# Patient Record
Sex: Female | Born: 1980 | Race: White | Hispanic: No
Health system: Southern US, Community
[De-identification: ages and names within clinical notes are randomized; demographics above are authoritative.]

---

## 2003-11-20 ENCOUNTER — Ambulatory Visit: Payer: Self-pay | Admitting: Internal Medicine

## 2003-12-02 ENCOUNTER — Ambulatory Visit: Payer: Self-pay | Admitting: Internal Medicine

## 2004-01-21 ENCOUNTER — Observation Stay: Payer: Self-pay

## 2004-01-29 ENCOUNTER — Emergency Department: Payer: Self-pay | Admitting: Emergency Medicine

## 2004-05-15 ENCOUNTER — Observation Stay: Payer: Self-pay

## 2004-06-18 ENCOUNTER — Observation Stay: Payer: Self-pay | Admitting: Obstetrics and Gynecology

## 2004-07-18 ENCOUNTER — Observation Stay: Payer: Self-pay | Admitting: Unknown Physician Specialty

## 2004-09-24 ENCOUNTER — Emergency Department: Payer: Self-pay | Admitting: Emergency Medicine

## 2005-03-28 ENCOUNTER — Inpatient Hospital Stay: Payer: Self-pay | Admitting: Internal Medicine

## 2005-04-30 ENCOUNTER — Emergency Department: Payer: Self-pay | Admitting: Emergency Medicine

## 2005-05-26 ENCOUNTER — Emergency Department: Payer: Self-pay | Admitting: Emergency Medicine

## 2005-06-20 ENCOUNTER — Emergency Department: Payer: Self-pay | Admitting: General Practice

## 2005-08-23 ENCOUNTER — Ambulatory Visit: Payer: Self-pay

## 2006-01-09 ENCOUNTER — Emergency Department: Payer: Self-pay | Admitting: Emergency Medicine

## 2006-01-18 ENCOUNTER — Emergency Department: Payer: Self-pay | Admitting: Unknown Physician Specialty

## 2006-01-26 ENCOUNTER — Emergency Department: Payer: Self-pay | Admitting: Unknown Physician Specialty

## 2006-03-16 ENCOUNTER — Emergency Department: Payer: Self-pay | Admitting: Emergency Medicine

## 2006-03-24 ENCOUNTER — Emergency Department: Payer: Self-pay | Admitting: Emergency Medicine

## 2006-05-31 ENCOUNTER — Observation Stay: Payer: Self-pay | Admitting: Obstetrics and Gynecology

## 2007-04-26 ENCOUNTER — Emergency Department: Payer: Self-pay | Admitting: Emergency Medicine

## 2007-07-13 ENCOUNTER — Inpatient Hospital Stay: Payer: Self-pay | Admitting: Internal Medicine

## 2007-10-03 ENCOUNTER — Ambulatory Visit: Payer: Self-pay | Admitting: Family Medicine

## 2007-11-27 ENCOUNTER — Ambulatory Visit: Payer: Self-pay | Admitting: Urology

## 2007-11-29 ENCOUNTER — Ambulatory Visit: Payer: Self-pay | Admitting: Urology

## 2007-12-04 ENCOUNTER — Emergency Department: Payer: Self-pay | Admitting: Emergency Medicine

## 2008-08-31 ENCOUNTER — Ambulatory Visit: Payer: Self-pay | Admitting: Internal Medicine

## 2008-09-30 ENCOUNTER — Inpatient Hospital Stay: Payer: Self-pay | Admitting: Student

## 2008-12-13 ENCOUNTER — Emergency Department: Payer: Self-pay | Admitting: Unknown Physician Specialty

## 2009-01-13 ENCOUNTER — Emergency Department: Payer: Self-pay | Admitting: Emergency Medicine

## 2009-01-21 ENCOUNTER — Emergency Department: Payer: Self-pay | Admitting: Emergency Medicine

## 2009-06-06 ENCOUNTER — Emergency Department: Payer: Self-pay | Admitting: Emergency Medicine

## 2009-09-11 ENCOUNTER — Emergency Department: Payer: Self-pay | Admitting: Unknown Physician Specialty

## 2009-11-13 ENCOUNTER — Emergency Department: Payer: Self-pay | Admitting: Emergency Medicine

## 2009-11-18 ENCOUNTER — Emergency Department: Payer: Self-pay | Admitting: Emergency Medicine

## 2010-01-28 ENCOUNTER — Ambulatory Visit: Payer: Self-pay | Admitting: Unknown Physician Specialty

## 2010-03-29 ENCOUNTER — Emergency Department: Payer: Self-pay | Admitting: Emergency Medicine

## 2010-05-02 ENCOUNTER — Inpatient Hospital Stay: Payer: Self-pay | Admitting: Specialist

## 2010-10-27 ENCOUNTER — Ambulatory Visit: Payer: Self-pay | Admitting: Family Medicine

## 2010-10-28 ENCOUNTER — Emergency Department: Payer: Self-pay | Admitting: Internal Medicine

## 2011-02-15 ENCOUNTER — Inpatient Hospital Stay: Payer: Self-pay | Admitting: Internal Medicine

## 2011-02-15 LAB — COMPREHENSIVE METABOLIC PANEL
Albumin: 3.6 g/dL (ref 3.4–5.0)
Anion Gap: 7 (ref 7–16)
BUN: 11 mg/dL (ref 7–18)
Bilirubin,Total: 6.4 mg/dL — ABNORMAL HIGH (ref 0.2–1.0)
Chloride: 109 mmol/L — ABNORMAL HIGH (ref 98–107)
Co2: 23 mmol/L (ref 21–32)
Creatinine: 0.47 mg/dL — ABNORMAL LOW (ref 0.60–1.30)
EGFR (African American): >60
EGFR (Non-African Amer.): >60
Osmolality: 277 (ref 275–301)
Potassium: 3.7 mmol/L (ref 3.5–5.1)
SGPT (ALT): 74 U/L
Sodium: 139 mmol/L (ref 136–145)
Total Protein: 6.1 g/dL — ABNORMAL LOW (ref 6.4–8.2)

## 2011-02-15 LAB — URINALYSIS, COMPLETE
Glucose,UR: NEGATIVE mg/dL (ref 0–75)
Ketone: NEGATIVE
Nitrite: NEGATIVE
Ph: 5 (ref 4.5–8.0)
Protein: NEGATIVE
Specific Gravity: 1.011 (ref 1.003–1.030)
Squamous Epithelial: 1

## 2011-02-15 LAB — CSF CELL COUNT WITH DIFFERENTIAL
CSF Tube #: 3
Eosinophil: 0 %
Lymphocytes: 100 %
Monocytes/Macrophages: 0 %
Neutrophils: 0 %
RBC (CSF): 0 /mm3

## 2011-02-15 LAB — CBC
HGB: 7.5 g/dL — ABNORMAL LOW (ref 12.0–16.0)
MCH: 41.2 pg — ABNORMAL HIGH (ref 26.0–34.0)
MCHC: 32.3 g/dL (ref 32.0–36.0)
Platelet: 852 10*3/uL — ABNORMAL HIGH (ref 150–440)
RBC: 1.82 10*6/uL — ABNORMAL LOW (ref 3.80–5.20)
RDW: 16.3 % — ABNORMAL HIGH (ref 11.5–14.5)
WBC: 21.8 10*3/uL — ABNORMAL HIGH (ref 3.6–11.0)

## 2011-02-15 LAB — PREGNANCY, URINE: Pregnancy Test, Urine: NEGATIVE m[IU]/mL

## 2011-02-15 LAB — GLUCOSE, CSF: Glucose, CSF: 64 mg/dL (ref 40–75)

## 2011-02-15 LAB — PROTEIN, CSF: Protein, CSF: 34 mg/dL (ref 15–45)

## 2011-02-15 LAB — APTT: Activated PTT: 31.8 secs (ref 23.6–35.9)

## 2011-02-15 LAB — RAPID INFLUENZA A&B ANTIGENS

## 2011-02-16 LAB — VANCOMYCIN, TROUGH: Vancomycin, Trough: 7 ug/mL — ABNORMAL LOW (ref 10–20)

## 2011-02-16 LAB — URINE CULTURE

## 2011-02-16 LAB — CBC WITH DIFFERENTIAL/PLATELET
Bands: 4 %
Basophil %: 0.5 %
Eosinophil #: 0.1 10*3/uL (ref 0.0–0.7)
Eosinophil: 1 %
HGB: 9 g/dL — ABNORMAL LOW (ref 12.0–16.0)
MCH: 36.4 pg — ABNORMAL HIGH (ref 26.0–34.0)
MCHC: 33.1 g/dL (ref 32.0–36.0)
MCV: 110 fL — ABNORMAL HIGH (ref 80–100)
Monocyte #: 0.8 10*3/uL — ABNORMAL HIGH (ref 0.0–0.7)
Monocytes: 9 %
NRBC/100 WBC: 6 /
Neutrophil %: 59.1 %
Platelet: 533 10*3/uL — ABNORMAL HIGH (ref 150–440)
RBC: 2.48 10*6/uL — ABNORMAL LOW (ref 3.80–5.20)
Segmented Neutrophils: 57 %
WBC: 10.8 10*3/uL (ref 3.6–11.0)

## 2011-02-16 LAB — BASIC METABOLIC PANEL
Anion Gap: 7 (ref 7–16)
BUN: 6 mg/dL — ABNORMAL LOW (ref 7–18)
Calcium, Total: 8.3 mg/dL — ABNORMAL LOW (ref 8.5–10.1)
Co2: 24 mmol/L (ref 21–32)
EGFR (African American): >60
EGFR (Non-African Amer.): >60
Glucose: 104 mg/dL — ABNORMAL HIGH (ref 65–99)
Osmolality: 277 (ref 275–301)

## 2011-02-18 LAB — CSF CULTURE W GRAM STAIN

## 2011-02-19 ENCOUNTER — Emergency Department: Payer: Self-pay | Admitting: Emergency Medicine

## 2011-02-20 LAB — ETHANOL: Ethanol %: <0.003 % (ref 0.000–0.080)

## 2011-02-20 LAB — COMPREHENSIVE METABOLIC PANEL
Albumin: 4.2 g/dL (ref 3.4–5.0)
Anion Gap: 13 (ref 7–16)
BUN: 10 mg/dL (ref 7–18)
Bilirubin,Total: 3.2 mg/dL — ABNORMAL HIGH (ref 0.2–1.0)
Calcium, Total: 9.2 mg/dL (ref 8.5–10.1)
EGFR (African American): >60
Glucose: 95 mg/dL (ref 65–99)
Osmolality: 284 (ref 275–301)
SGOT(AST): 36 U/L (ref 15–37)
Sodium: 143 mmol/L (ref 136–145)
Total Protein: 7.7 g/dL (ref 6.4–8.2)

## 2011-02-20 LAB — CBC
HGB: 9.3 g/dL — ABNORMAL LOW (ref 12.0–16.0)
MCH: 35.7 pg — ABNORMAL HIGH (ref 26.0–34.0)
Platelet: 766 10*3/uL — ABNORMAL HIGH (ref 150–440)
RBC: 2.61 10*6/uL — ABNORMAL LOW (ref 3.80–5.20)

## 2011-02-21 LAB — CULTURE, BLOOD (SINGLE)

## 2011-03-16 ENCOUNTER — Emergency Department: Payer: Self-pay | Admitting: Emergency Medicine

## 2011-05-02 ENCOUNTER — Emergency Department: Payer: Self-pay | Admitting: Emergency Medicine

## 2011-08-26 ENCOUNTER — Inpatient Hospital Stay: Payer: Self-pay | Admitting: Internal Medicine

## 2011-08-26 LAB — CBC WITH DIFFERENTIAL/PLATELET
HCT: 22.8 % — ABNORMAL LOW (ref 35.0–47.0)
HGB: 7.3 g/dL — ABNORMAL LOW (ref 12.0–16.0)
Lymphocytes: 39 %
MCHC: 32.2 g/dL (ref 32.0–36.0)
Monocytes: 7 %
RDW: 17 % — ABNORMAL HIGH (ref 11.5–14.5)
Segmented Neutrophils: 54 %
WBC: 16.4 10*3/uL — ABNORMAL HIGH (ref 3.6–11.0)

## 2011-08-26 LAB — COMPREHENSIVE METABOLIC PANEL
Albumin: 4.1 g/dL (ref 3.4–5.0)
BUN: 9 mg/dL (ref 7–18)
Bilirubin,Total: 4.2 mg/dL — ABNORMAL HIGH (ref 0.2–1.0)
Co2: 22 mmol/L (ref 21–32)
Creatinine: 0.62 mg/dL (ref 0.60–1.30)
EGFR (African American): >60
Glucose: 84 mg/dL (ref 65–99)
Osmolality: 275 (ref 275–301)
Potassium: 3.8 mmol/L (ref 3.5–5.1)
SGOT(AST): 37 U/L (ref 15–37)
SGPT (ALT): 56 U/L
Sodium: 139 mmol/L (ref 136–145)
Total Protein: 7.2 g/dL (ref 6.4–8.2)

## 2011-08-26 LAB — URINALYSIS, COMPLETE
Blood: NEGATIVE
Glucose,UR: NEGATIVE mg/dL (ref 0–75)
Ketone: NEGATIVE
Nitrite: NEGATIVE
Ph: 6 (ref 4.5–8.0)
Protein: NEGATIVE
Specific Gravity: 1.01 (ref 1.003–1.030)
Squamous Epithelial: 4
WBC UR: 3 /HPF (ref 0–5)

## 2011-08-26 LAB — PROTIME-INR: INR: 1.1

## 2011-08-26 LAB — CK TOTAL AND CKMB (NOT AT ARMC): CK-MB: 1.2 ng/mL (ref 0.5–3.6)

## 2011-08-27 LAB — BASIC METABOLIC PANEL
Anion Gap: 9 (ref 7–16)
BUN: 12 mg/dL (ref 7–18)
Chloride: 110 mmol/L — ABNORMAL HIGH (ref 98–107)
Creatinine: 0.57 mg/dL — ABNORMAL LOW (ref 0.60–1.30)
EGFR (African American): >60
EGFR (Non-African Amer.): >60
Glucose: 101 mg/dL — ABNORMAL HIGH (ref 65–99)
Sodium: 141 mmol/L (ref 136–145)

## 2011-08-27 LAB — HEMOGLOBIN: HGB: 6.9 g/dL — ABNORMAL LOW (ref 12.0–16.0)

## 2011-08-27 LAB — CBC WITH DIFFERENTIAL/PLATELET
Eosinophil: 6 %
HCT: 19.8 % — ABNORMAL LOW (ref 35.0–47.0)
HGB: 6.7 g/dL — ABNORMAL LOW (ref 12.0–16.0)
MCH: 45.3 pg — ABNORMAL HIGH (ref 26.0–34.0)
MCV: 134 fL — ABNORMAL HIGH (ref 80–100)
Monocytes: 7 %
NRBC/100 WBC: 10 /
Platelet: 648 10*3/uL — ABNORMAL HIGH (ref 150–440)
RBC: 1.48 10*6/uL — ABNORMAL LOW (ref 3.80–5.20)
RDW: 16.2 % — ABNORMAL HIGH (ref 11.5–14.5)
Segmented Neutrophils: 31 %
WBC: 8.7 10*3/uL (ref 3.6–11.0)

## 2011-08-28 LAB — URINE CULTURE

## 2011-09-01 LAB — CULTURE, BLOOD (SINGLE)

## 2012-03-16 ENCOUNTER — Emergency Department: Payer: Self-pay | Admitting: Emergency Medicine

## 2012-03-17 ENCOUNTER — Emergency Department: Payer: Self-pay | Admitting: Emergency Medicine

## 2012-03-17 LAB — URINALYSIS, COMPLETE
Bilirubin,UR: NEGATIVE
Glucose,UR: NEGATIVE mg/dL (ref 0–75)
Ketone: NEGATIVE
Ph: 5 (ref 4.5–8.0)
Protein: NEGATIVE
RBC,UR: 1 /HPF (ref 0–5)
Specific Gravity: 1.015 (ref 1.003–1.030)
Squamous Epithelial: 2

## 2012-03-17 LAB — COMPREHENSIVE METABOLIC PANEL
Albumin: 3.7 g/dL (ref 3.4–5.0)
Anion Gap: 11 (ref 7–16)
BUN: 13 mg/dL (ref 7–18)
Calcium, Total: 8.2 mg/dL — ABNORMAL LOW (ref 8.5–10.1)
Chloride: 108 mmol/L — ABNORMAL HIGH (ref 98–107)
Creatinine: 0.37 mg/dL — ABNORMAL LOW (ref 0.60–1.30)
EGFR (African American): >60
Glucose: 101 mg/dL — ABNORMAL HIGH (ref 65–99)
Osmolality: 278 (ref 275–301)
Potassium: 4.3 mmol/L (ref 3.5–5.1)
SGOT(AST): 65 U/L — ABNORMAL HIGH (ref 15–37)
Sodium: 139 mmol/L (ref 136–145)
Total Protein: 6.9 g/dL (ref 6.4–8.2)

## 2012-03-17 LAB — RAPID INFLUENZA A&B ANTIGENS

## 2012-03-17 LAB — CBC
HGB: 7.3 g/dL — ABNORMAL LOW (ref 12.0–16.0)
MCHC: 32 g/dL (ref 32.0–36.0)
RBC: 1.73 10*6/uL — ABNORMAL LOW (ref 3.80–5.20)
WBC: 16.7 10*3/uL — ABNORMAL HIGH (ref 3.6–11.0)

## 2012-03-30 ENCOUNTER — Emergency Department: Payer: Self-pay | Admitting: Emergency Medicine

## 2012-03-30 LAB — COMPREHENSIVE METABOLIC PANEL
Albumin: 4.1 g/dL (ref 3.4–5.0)
BUN: 12 mg/dL (ref 7–18)
Bilirubin,Total: 5.2 mg/dL — ABNORMAL HIGH (ref 0.2–1.0)
Calcium, Total: 8.4 mg/dL — ABNORMAL LOW (ref 8.5–10.1)
Chloride: 107 mmol/L (ref 98–107)
EGFR (Non-African Amer.): >60
Potassium: 3.5 mmol/L (ref 3.5–5.1)
SGOT(AST): 99 U/L — ABNORMAL HIGH (ref 15–37)
SGPT (ALT): 123 U/L — ABNORMAL HIGH (ref 12–78)
Sodium: 137 mmol/L (ref 136–145)

## 2012-03-30 LAB — CBC
HCT: 22.3 % — ABNORMAL LOW (ref 35.0–47.0)
HGB: 7 g/dL — ABNORMAL LOW (ref 12.0–16.0)
MCH: 40.6 pg — ABNORMAL HIGH (ref 26.0–34.0)
MCHC: 31.5 g/dL — ABNORMAL LOW (ref 32.0–36.0)
MCV: 129 fL — ABNORMAL HIGH (ref 80–100)
Platelet: 905 10*3/uL — ABNORMAL HIGH (ref 150–440)
RDW: 13.6 % (ref 11.5–14.5)

## 2012-03-30 LAB — TROPONIN I: Troponin-I: <0.02 ng/mL

## 2012-03-31 LAB — URINALYSIS, COMPLETE
Bacteria: NONE SEEN
Bilirubin,UR: NEGATIVE
Glucose,UR: NEGATIVE mg/dL (ref 0–75)
Ketone: NEGATIVE
Protein: NEGATIVE
RBC,UR: 2 /HPF (ref 0–5)
Specific Gravity: 1.016 (ref 1.003–1.030)
Squamous Epithelial: <1

## 2012-06-28 LAB — CBC
HCT: 22.6 % — ABNORMAL LOW (ref 35.0–47.0)
MCV: 129 fL — ABNORMAL HIGH (ref 80–100)
Platelet: 833 10*3/uL — ABNORMAL HIGH (ref 150–440)
RBC: 1.76 10*6/uL — ABNORMAL LOW (ref 3.80–5.20)
WBC: 14.5 10*3/uL — ABNORMAL HIGH (ref 3.6–11.0)

## 2012-06-28 LAB — COMPREHENSIVE METABOLIC PANEL
Albumin: 4.3 g/dL (ref 3.4–5.0)
Anion Gap: 7 (ref 7–16)
BUN: 10 mg/dL (ref 7–18)
Bilirubin,Total: 4.8 mg/dL — ABNORMAL HIGH (ref 0.2–1.0)
Co2: 24 mmol/L (ref 21–32)
EGFR (African American): >60
Osmolality: 274 (ref 275–301)
Potassium: 3.6 mmol/L (ref 3.5–5.1)
SGOT(AST): 50 U/L — ABNORMAL HIGH (ref 15–37)
SGPT (ALT): 67 U/L (ref 12–78)
Sodium: 138 mmol/L (ref 136–145)
Total Protein: 7.5 g/dL (ref 6.4–8.2)

## 2012-06-28 LAB — PREGNANCY, URINE: Pregnancy Test, Urine: NEGATIVE m[IU]/mL

## 2012-06-28 LAB — URINALYSIS, COMPLETE
Bacteria: NONE SEEN
Glucose,UR: NEGATIVE mg/dL (ref 0–75)
Leukocyte Esterase: NEGATIVE
Ph: 6 (ref 4.5–8.0)
Protein: NEGATIVE
Specific Gravity: 1.017 (ref 1.003–1.030)
Squamous Epithelial: 1

## 2012-06-29 ENCOUNTER — Inpatient Hospital Stay: Payer: Self-pay | Admitting: Internal Medicine

## 2012-06-29 LAB — BASIC METABOLIC PANEL
Anion Gap: 8 (ref 7–16)
BUN: 6 mg/dL — ABNORMAL LOW (ref 7–18)
Calcium, Total: 8 mg/dL — ABNORMAL LOW (ref 8.5–10.1)
Chloride: 110 mmol/L — ABNORMAL HIGH (ref 98–107)
Co2: 24 mmol/L (ref 21–32)
EGFR (African American): >60
EGFR (Non-African Amer.): >60
Osmolality: 281 (ref 275–301)
Potassium: 4.2 mmol/L (ref 3.5–5.1)
Sodium: 142 mmol/L (ref 136–145)

## 2012-06-29 LAB — CBC WITH DIFFERENTIAL/PLATELET
Basophil: 3 %
Eosinophil: 3 %
HCT: 19.2 % — ABNORMAL LOW (ref 35.0–47.0)
HGB: 6.3 g/dL — ABNORMAL LOW (ref 12.0–16.0)
MCHC: 32.7 g/dL (ref 32.0–36.0)
Monocytes: 5 %
NRBC/100 WBC: 2 /
RBC: 1.48 10*6/uL — ABNORMAL LOW (ref 3.80–5.20)

## 2012-06-30 LAB — CBC WITH DIFFERENTIAL/PLATELET
Basophil: 1 %
Lymphocytes: 56 %
MCH: 39.1 pg — ABNORMAL HIGH (ref 26.0–34.0)
MCHC: 34.2 g/dL (ref 32.0–36.0)
MCV: 114 fL — ABNORMAL HIGH (ref 80–100)
Monocytes: 3 %
Platelet: 646 10*3/uL — ABNORMAL HIGH (ref 150–440)
RBC: 1.94 10*6/uL — ABNORMAL LOW (ref 3.80–5.20)
RDW: 25.4 % — ABNORMAL HIGH (ref 11.5–14.5)
Segmented Neutrophils: 37 %
WBC: 11.9 10*3/uL — ABNORMAL HIGH (ref 3.6–11.0)

## 2012-07-04 LAB — CULTURE, BLOOD (SINGLE)

## 2012-11-22 ENCOUNTER — Emergency Department: Payer: Self-pay | Admitting: Emergency Medicine

## 2012-11-22 LAB — URINALYSIS, COMPLETE
Bacteria: NONE SEEN
Bilirubin,UR: NEGATIVE
Glucose,UR: NEGATIVE mg/dL (ref 0–75)
Ketone: NEGATIVE
Nitrite: NEGATIVE
Ph: 6 (ref 4.5–8.0)
Protein: NEGATIVE
Specific Gravity: 1.004 (ref 1.003–1.030)
WBC UR: 1 /HPF (ref 0–5)

## 2012-11-22 LAB — CK TOTAL AND CKMB (NOT AT ARMC)
CK, Total: 45 U/L (ref 21–215)
CK-MB: 0.9 ng/mL (ref 0.5–3.6)

## 2012-11-22 LAB — DRUG SCREEN, URINE
Amphetamines, Ur Screen: NEGATIVE (ref ?–1000)
Barbiturates, Ur Screen: NEGATIVE (ref ?–200)
Benzodiazepine, Ur Scrn: NEGATIVE (ref ?–200)
Cannabinoid 50 Ng, Ur ~~LOC~~: NEGATIVE (ref ?–50)
Cocaine Metabolite,Ur ~~LOC~~: NEGATIVE (ref ?–300)
Methadone, Ur Screen: NEGATIVE (ref ?–300)
Opiate, Ur Screen: NEGATIVE (ref ?–300)

## 2012-11-22 LAB — BASIC METABOLIC PANEL
Anion Gap: 2 — ABNORMAL LOW (ref 7–16)
Chloride: 109 mmol/L — ABNORMAL HIGH (ref 98–107)
Co2: 23 mmol/L (ref 21–32)
Creatinine: <0.1 mg/dL — ABNORMAL LOW (ref 0.60–1.30)
Osmolality: 266 (ref 275–301)
Sodium: 134 mmol/L — ABNORMAL LOW (ref 136–145)

## 2012-11-22 LAB — HEPATIC FUNCTION PANEL A (ARMC)
Albumin: 3.6 g/dL (ref 3.4–5.0)
Alkaline Phosphatase: 93 U/L (ref 50–136)
Bilirubin, Direct: 0.2 mg/dL (ref 0.00–0.20)
Bilirubin,Total: 5.8 mg/dL — ABNORMAL HIGH (ref 0.2–1.0)
SGOT(AST): 73 U/L — ABNORMAL HIGH (ref 15–37)
Total Protein: 10.3 g/dL — ABNORMAL HIGH (ref 6.4–8.2)

## 2012-11-22 LAB — POTASSIUM: Potassium: 3.9 mmol/L (ref 3.5–5.1)

## 2012-11-22 LAB — CBC
HCT: 25 % — ABNORMAL LOW (ref 35.0–47.0)
HGB: 7.9 g/dL — ABNORMAL LOW (ref 12.0–16.0)
MCHC: 31.5 g/dL — ABNORMAL LOW (ref 32.0–36.0)
MCV: 138 fL — ABNORMAL HIGH (ref 80–100)
Platelet: 785 10*3/uL — ABNORMAL HIGH (ref 150–440)
RBC: 1.82 10*6/uL — ABNORMAL LOW (ref 3.80–5.20)
RDW: 16 % — ABNORMAL HIGH (ref 11.5–14.5)
WBC: 12.8 10*3/uL — ABNORMAL HIGH (ref 3.6–11.0)

## 2012-11-22 LAB — LIPASE, BLOOD: Lipase: 95 U/L (ref 73–393)

## 2012-11-22 LAB — PREGNANCY, URINE: Pregnancy Test, Urine: NEGATIVE m[IU]/mL

## 2012-11-22 LAB — TROPONIN I: Troponin-I: <0.02 ng/mL

## 2013-01-23 ENCOUNTER — Emergency Department: Payer: Self-pay | Admitting: Emergency Medicine

## 2013-01-23 LAB — BASIC METABOLIC PANEL
Anion Gap: 6 — ABNORMAL LOW (ref 7–16)
BUN: 12 mg/dL (ref 7–18)
Chloride: 105 mmol/L (ref 98–107)
Co2: 24 mmol/L (ref 21–32)
EGFR (African American): >60
EGFR (Non-African Amer.): >60
Glucose: 125 mg/dL — ABNORMAL HIGH (ref 65–99)
Osmolality: 271 (ref 275–301)
Potassium: 3.7 mmol/L (ref 3.5–5.1)
Sodium: 135 mmol/L — ABNORMAL LOW (ref 136–145)

## 2013-01-23 LAB — URINALYSIS, COMPLETE
Bilirubin,UR: NEGATIVE
Glucose,UR: NEGATIVE mg/dL (ref 0–75)
Ketone: NEGATIVE
Nitrite: NEGATIVE
Protein: NEGATIVE
RBC,UR: 1 /HPF (ref 0–5)
Specific Gravity: 1.017 (ref 1.003–1.030)
Squamous Epithelial: 1
WBC UR: 4 /HPF (ref 0–5)

## 2013-01-23 LAB — CBC
MCH: 40.3 pg — ABNORMAL HIGH (ref 26.0–34.0)
MCHC: 32.4 g/dL (ref 32.0–36.0)
Platelet: 658 10*3/uL — ABNORMAL HIGH (ref 150–440)
RBC: 1.57 10*6/uL — ABNORMAL LOW (ref 3.80–5.20)

## 2013-01-23 LAB — PROTIME-INR
INR: 1.2
Prothrombin Time: 14.9 secs — ABNORMAL HIGH (ref 11.5–14.7)

## 2013-01-23 LAB — RAPID INFLUENZA A&B ANTIGENS

## 2013-01-25 LAB — CBC WITH DIFFERENTIAL/PLATELET
Comment - H1-Com5: NORMAL
Eosinophil: 2 %
HCT: 21 % — ABNORMAL LOW (ref 35.0–47.0)
HGB: 7.2 g/dL — ABNORMAL LOW (ref 12.0–16.0)
Lymphocytes: 48 %
MCHC: 34.2 g/dL (ref 32.0–36.0)
MCV: 108 fL — ABNORMAL HIGH (ref 80–100)
NRBC/100 WBC: 4 /
RBC: 1.94 10*6/uL — ABNORMAL LOW (ref 3.80–5.20)
RDW: 26.3 % — ABNORMAL HIGH (ref 11.5–14.5)
WBC: 9.4 10*3/uL (ref 3.6–11.0)

## 2013-01-25 LAB — URINALYSIS, COMPLETE
Bacteria: NONE SEEN
Bilirubin,UR: NEGATIVE
Glucose,UR: NEGATIVE mg/dL (ref 0–75)
Protein: NEGATIVE
Squamous Epithelial: 2
WBC UR: 4 /HPF (ref 0–5)

## 2013-01-25 LAB — COMPREHENSIVE METABOLIC PANEL
Albumin: 3.8 g/dL (ref 3.4–5.0)
Bilirubin,Total: 4.7 mg/dL — ABNORMAL HIGH (ref 0.2–1.0)
Calcium, Total: 8.6 mg/dL (ref 8.5–10.1)
Chloride: 104 mmol/L (ref 98–107)
Co2: 25 mmol/L (ref 21–32)
Creatinine: 0.37 mg/dL — ABNORMAL LOW (ref 0.60–1.30)
EGFR (African American): >60
EGFR (Non-African Amer.): >60
Glucose: 100 mg/dL — ABNORMAL HIGH (ref 65–99)
Osmolality: 267 (ref 275–301)
SGOT(AST): 115 U/L — ABNORMAL HIGH (ref 15–37)
SGPT (ALT): 108 U/L — ABNORMAL HIGH (ref 12–78)

## 2013-01-25 LAB — DRUG SCREEN, URINE
Barbiturates, Ur Screen: NEGATIVE (ref ?–200)
Benzodiazepine, Ur Scrn: NEGATIVE (ref ?–200)
Cocaine Metabolite,Ur ~~LOC~~: NEGATIVE (ref ?–300)
Opiate, Ur Screen: NEGATIVE (ref ?–300)
Phencyclidine (PCP) Ur S: NEGATIVE (ref ?–25)
Tricyclic, Ur Screen: NEGATIVE (ref ?–1000)

## 2013-01-25 LAB — PREGNANCY, URINE: Pregnancy Test, Urine: NEGATIVE m[IU]/mL

## 2013-01-25 LAB — HCG, QUANTITATIVE, PREGNANCY: Beta Hcg, Quant.: <1 m[IU]/mL — ABNORMAL LOW

## 2013-01-26 ENCOUNTER — Inpatient Hospital Stay: Payer: Self-pay | Admitting: Internal Medicine

## 2013-01-26 LAB — CBC WITH DIFFERENTIAL/PLATELET
HCT: 18.8 % — ABNORMAL LOW (ref 35.0–47.0)
Lymphocytes: 50 %
MCH: 36.4 pg — ABNORMAL HIGH (ref 26.0–34.0)
MCV: 110 fL — ABNORMAL HIGH (ref 80–100)
Platelet: 650 10*3/uL — ABNORMAL HIGH (ref 150–440)
RBC: 1.7 10*6/uL — ABNORMAL LOW (ref 3.80–5.20)
RDW: 25.8 % — ABNORMAL HIGH (ref 11.5–14.5)

## 2013-01-26 LAB — COMPREHENSIVE METABOLIC PANEL
Anion Gap: 4 — ABNORMAL LOW (ref 7–16)
BUN: 6 mg/dL — ABNORMAL LOW (ref 7–18)
Bilirubin,Total: 3.3 mg/dL — ABNORMAL HIGH (ref 0.2–1.0)
Calcium, Total: 7.9 mg/dL — ABNORMAL LOW (ref 8.5–10.1)
Chloride: 106 mmol/L (ref 98–107)
Co2: 27 mmol/L (ref 21–32)
Creatinine: 0.52 mg/dL — ABNORMAL LOW (ref 0.60–1.30)
EGFR (African American): >60
EGFR (Non-African Amer.): >60
Glucose: 100 mg/dL — ABNORMAL HIGH (ref 65–99)
Osmolality: 272 (ref 275–301)
Potassium: 3.9 mmol/L (ref 3.5–5.1)
SGPT (ALT): 91 U/L — ABNORMAL HIGH (ref 12–78)
Sodium: 137 mmol/L (ref 136–145)
Total Protein: 5.7 g/dL — ABNORMAL LOW (ref 6.4–8.2)

## 2013-01-27 LAB — CBC WITH DIFFERENTIAL/PLATELET
Comment - H1-Com4: NORMAL
Eosinophil: 1 %
Lymphocytes: 46 %
MCH: 34.6 pg — ABNORMAL HIGH (ref 26.0–34.0)
MCHC: 33 g/dL (ref 32.0–36.0)
Monocytes: 3 %
NRBC/100 WBC: 3 /
Platelet: 661 10*3/uL — ABNORMAL HIGH (ref 150–440)
RBC: 2.32 10*6/uL — ABNORMAL LOW (ref 3.80–5.20)
RDW: 26.7 % — ABNORMAL HIGH (ref 11.5–14.5)
WBC: 13.3 10*3/uL — ABNORMAL HIGH (ref 3.6–11.0)

## 2013-01-28 LAB — WBCS, STOOL

## 2013-01-29 LAB — STOOL CULTURE

## 2014-05-20 NOTE — H&P (Signed)
PATIENT NAME:  Ruth Shelton, GOUGE MR#:  [id number] DATE OF BIRTH:  [birth year]  DATE OF ADMISSION:  08/26/2011  REFERRING PHYSICIAN:  Dr. Daryel November PRIMARY CARE PHYSICIAN:  [facility]    CHIEF COMPLAINT: Feeling unwell, chronic fevers.   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 The patient stated that she has been having some dizziness for the past week or so. She has started to have bouts of diarrhea, multiple per day, lasting several days about a week ago, which have now resolved. She has formed stool now. She feels some soreness in her belly, however. She started to feel unwell again and started to have left eyelid drooping with some tingling in her arms lasting less than a minute. She has been overall running low-grade fevers for several months per her. Every time she goes to her primary care physician they tell her she has a low-grade fever.  Her temperature runs about 100.2. She noticed yesterday development of swelling in bilateral lymph nodes on both sides. She denies having any sore throat. No cough. No shortness of breath. She has some right ear pain, however. Hospitalist service was contacted for further evaluation and management as she was noted to have leukocytosis of 16,000 and some tachycardia.   PAST MEDICAL HISTORY:  1. Pyruvate kinase deficiency. 2. Anemia requiring transfusions with baseline hemoglobin around seven. 3. History of iron overload. 4. 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 5. History of cerebrovascular accident in the past. 6. History of seizures in the past.  7. Status post splenectomy, cholecystectomy, and two C-sections.   ALLERGIES: Aspirin and morphine.   CURRENT MEDICATIONS:  1. Penicillin VK 250 mg b.i.d.   2. Folic acid 1 mg daily.  3. Zyrtec p.r.n.  4. Flonase p.r.n.  5. Ativan p.r.n., does not know the dosage.   SOCIAL HISTORY: Smokes half a pack a day. No alcohol or drug use. She is on disability.   FAMILY HISTORY: Brother with testicular cancer and myocardial infarction.   REVIEW OF SYSTEMS: CONSTITUTIONAL: Low-grade fevers, fatigue, and overall weakness and some abdominal cramping. No weight changes. EYES: Has some blurry vision. No glaucoma. ENT: Some ringing in the ear on the right side. Some ear pain on the right. No hearing loss, or discharge.  RESPIRATORY: No cough, wheezing, hemoptysis, or shortness of breath. CARDIOVASCULAR: No chest pain, arrhythmia, or high blood pressure history. GI: No vomiting, has had some nausea for the past week, abdominal "burning sensation".  No hematemesis or melena. Had a bout of diarrhea which resolved about a week ago. GU: Denies dysuria or hematuria. HEME/LYMPH: Some swelling under the chin bilaterally. ENDOCRINE: No polyuria or nocturia.  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 No bleeding. Positive for swollen glands. SKIN: No new rashes. MUSCULOSKELETAL: Chronic arthritis.  NEUROLOGIC: Tingling in the left arm. No focal weakness. History of CVA.  PSYCH: Has some anxiety.   PHYSICAL EXAMINATION:  VITAL SIGNS: Temperature 100.1, pulse 99, respiratory rate 20, blood pressure 118/56, O2 sats  100% on room air.   GENERAL: The patient is a pleasant Caucasian female laying in bed in no obvious distress, talking in full sentences.   HEENT: Normocephalic, atraumatic. Pupils are equal and reactive. Pale conjunctivae. Anicteric sclerae. Dry mucous membranes. Extraocular muscles intact.  No exudate  on visualization of the throat.  Examination of the ears- on the right side it is tender on palpation of the tinea as well as some exudate. No bulging tympanic membrane, however.   NECK: Supple. No thyroid tenderness. There is bilateral  submandibular tender lymphadenopathy.   CARDIOVASCULAR: S1, S2, regular rhythm, tachycardic. No murmurs.   LUNGS: Clear to auscultation without wheezing or rhonchi.   ABDOMEN: Soft, nondistended. Slight tenderness on the right side. Positive bowel sounds in all quadrants.   EXTREMITIES: No significant lower extremity edema.   NEUROLOGICAL: Cranial nerves II-XII grossly intact. Strength is 5/5 in all extremities. Sensation is intact to light touch.   PSYCH: Awake, alert, oriented times three.  Pleasant, cooperative.   LABORATORY, DIAGNOSTIC, AND RADIOLOGICAL DATA:  Chloride 108, potassium 3.8, sodium 139, creatinine 0.62. LFTs: Total bilirubin is 4.2 and appears to be chronically elevated, otherwise within normal limits. Troponin negative times one. CK total 54. WBC 16.4. Hemoglobin 7.3, hematocrit 22.8,  platelets are 746. Of note, previous hemoglobin was 9.3 on 01/20.  INR 1.1. Urinalysis 2+ leukocyte esterase, no nitrites, four epithelial cells, three WBCs. CT of the head without contrast showing no acute evidence of intracranial abnormalities. X-ray of the chest is pending.   ASSESSMENT AND PLAN: We have a 34 year old female with history pyruvate kinase deficiency, chronic hemolytic anemia, and chronic hyperbilirubinemia with low-grade fevers for several months with an episode of diarrhea, now resolved, with weakness, fatigue, some lymphadenopathy and found to have SIRS criteria and probable otitis media. At this point we will admit the patient to the hospital. The patient does have SIRS criteria including tachycardia and significant leukocytosis. Thus far urinalysis is not suggestive of infection. X-ray of the chest although pending would likely not suggest a pneumonia as the patient has no hypoxia or cough which is productive or shortness of breath. On exam the patient appears to have otitis media. I will start the patient on Levaquin IV for now. Strep throat cultures are also done which are  pending. It is possible that this is viral in conjunction with a recent probable viral enteritis that the patient had. She has no further diarrhea at this point. Nonetheless, I will send in some stool cultures at this point. Follow x-ray of the chest. We will follow her fever curve at this point. In regards to her pyruvate kinase deficiency, she states her hemoglobins is in the sevens and this is around her baseline. The patient has chronic hyperbilirubinemia, which appears to be stable. I will trend her hemoglobin. If it trends lower, I will consider transfusion. She did complain of having some left eye droop and some tingling sensation in her left arm, which were transient and have currently resolved. She is neurologically intact and had a CT of the head, which is negative. I will continue her folic acid. For deep vein thrombosis prophylaxis she will be started on TEDs and SCDs.   CODE STATUS: The patient is FULL CODE.   TOTAL TIME SPENT: 50 minutes.   ____________________________ Krystal EatonShayiq Daleon Willinger, MD sa:bjt D: 08/26/2011 16:38:05 ET T: 08/26/2011 17:12:22 ET JOB#: [id number]  cc: Krystal EatonShayiq Valda Christenson, MD, <Dictator> [facility] IncHAYIQ [facility] CenterHMADZIA MD ELECTRONICALLY SIGNED 09/13/2011 15:17

## 2014-05-20 NOTE — Discharge Summary (Signed)
PATIENT NAME:  Ruth BrookeSOUTHARD, Little J MR#:  [id number] DATE OF BIRTH:  1980-02-08  DATE OF ADMISSION:  08/26/2011 DATE OF DISCHARGE:  08/28/2011  PRIMARY CARE PHYSICIAN: [facility]   PRESENTING COMPLAINT: Not feeling well with low-grade fever.    DISCHARGE DIAGNOSES:  1. Suspected viral illness.  2. Headache.  3. Right earache.  4. History of pyruvate kinase deficiency with chronic anemia.   CONDITION ON DISCHARGE: Fair.   MEDICATIONS:  1. Ranitidine 150 mg 1 capsule p.o. b.i.d.  2. Folic acid 1 mg p.o. daily.  3. Penicillin V 250 p.o. b.i.d.  4. Toradol 15 mg t.i.d. with meals p.r.n.  5. Levaquin 250 p.o. daily for three more days.  6. Tylenol 500 mg p.o. t.i.d. for headaches.   FOLLOW-UP:  1. Follow-up with Dr. Darreld McleanLinda Miles in 1 to 2 weeks.  2. Get Neurology referral through your primary care for headaches.   LABORATORY, DIAGNOSTIC, AND RADIOLOGICAL DATA: Hemoglobin at discharge 6.9, white count 8.7, platelet count 648, glucose 101, BUN 12, creatinine 0.57, sodium 141, potassium 4.0, chloride 110. Blood cultures negative in 36 hours.   Chest x-ray mild hyperinflation which may be voluntary or could reflect reactive airway disease.   Strep culture moderate growth of beta-hemolytic strep Group C. White count on admission was 16.4. Comprehensive metabolic panel within normal limits. Urinalysis negative for urinary tract infection. Urine culture mixed bacterial organisms.   HISTORY OF PRESENT ILLNESS: Ms. Ruth Shelton is a 34 year old Caucasian female with history of pyruvate kinase deficiency, and depression admitted with fever, headache, and diarrhea. She was admitted with:  1. Low-grade fever, mild leukocytosis. The patient did have symptoms of sore throat. She was already on Penicillin V at admission. She was started on p.o. Levaquin for earache. No ear discharge was noted. Right ear exam looked okay. She did not have any cervical neck lymphadenopathy. Her strep throat results were noted  after discharge and positive for Group C Strep. The patient is already on Levaquin and has a prescription penicillin which she was also recommended to continue taking. Her white count normalized and headache resolved. She did not have any fever while in the hospital.  2. Headache, could be part of viral syndrome. It could be possibly a new migraine headache. The patient says she's been having headaches on and off over the last several months. She was given IV Dilaudid and p.o. Percocet, however, Toradol worked for her. Will give her some prescription for p.r.n. basis only. She was advised to get Neurology opinion through [facility] referral.  3. Hypokalemia due to diarrhea, replaced.  4. Diarrhea, resolved, likely due to viral syndrome.  5. Depression.  6. History of chronic anemia due to pyruvate kinase deficiency. [facility] I talk with Dr. Josie SaundersGerard, Hematology on call covering for Dr. Marcheta GrammesMa. No indication for blood transfusion; need to follow-up with Hematology after discharge was recommended.   Hospital stay otherwise remained stable.   CODE STATUS: The patient remained a FULL CODE.   TIME SPENT: 40 minutes.   ____________________________ Wylie HailSona A. Allena KatzPatel, MD sap:drc D: 08/29/2011 12:40:27 ET T: 08/30/2011 11:21:25 ET JOB#: [id number]  cc: Myshawn Chiriboga A. Allena KatzPatel, MD, <Dictator>, [facility] Willow OraSONA A Johann Gascoigne MD ELECTRONICALLY SIGNED 09/06/2011 7:17

## 2014-05-23 NOTE — Discharge Summary (Signed)
PATIENT NAME:  Ruth Shelton, Ruth Shelton MR#:  [id number] DATE OF BIRTH:  [birth year]  DATE OF ADMISSION:  06/29/2012 DATE OF DISCHARGE:  06/30/2012  ADMITTING PHYSICIAN:  Dr. Clerance LavPadmaja Vasireddy.   DISCHARGING PHYSICIAN: Enid Baasadhika Feige Lowdermilk, MD.   PRIMARY MD: Dr. Darreld McleanLinda Miles.   DISCHARGE DIAGNOSES 1.  Flulike illness.  2.  Acute-on-chronic anemia.  3.  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  4.  Sinusitis.  5.  Sinus headache.   DISCHARGE MEDICATIONS:  1.  Penicillin-V. 2.  Potassium, 250 mg p.o. b.i.d.  3.  Folic acid 1 mg p.o. daily.  4.  Ativan 0.5 mg a day as needed for anxiety.  5.  Tramadol 50 mg p.o. q.6 hours p.r.n., for headache or pain.  6.  Levaquin 500 mg p.o. daily for 5 days.   DISCHARGE DIET: Regular diet.   DISCHARGE ACTIVITY: As tolerated.   FOLLOWUP INSTRUCTIONS: [facility] in 2 to 3 weeks.   LABS AND IMAGING STUDIES PRIOR TO DISCHARGE: WBC 11.9, hemoglobin 10.6 after 1 unit packed RBC transfusion.  Hematocrit is 22.2, platelet count is 646.  Blood cultures were negative while in the hospital.   Sodium 138, potassium 3.6, chloride 110, bicarbonate 24, BUN 10, creatinine 0.71, glucose  94, and calcium of 3.6.   Urinalysis negative for any infection.   BRIEF HOSPITAL COURSE: Ruth Shelton is a 34 year old female with a past medical history significant for pyruvate kinase deficiency resulting in hemolytic anemia, with multiple re-admissions for acute-on-chronic anemia requiring transfusions, [facility], presented to the hospital secondary to generalized body pains, headache, and flulike symptoms.   1.  Flulike illness, with nausea, vomiting and diarrhea, improved with IV fluids: She did have a low-grade fever of 99.6, with elevated WBC of 17,000 when she was admitted. She was started on Rocephin, ampicillin for possible meningitis. Patient did  not have any neck stiffness then, however she says that she gets these symptoms whenever she gets anemic. Her x-ray was negative. Urine was negative, so her antibiotics were stopped and her blood cultures came back negative. She improved with hydration.  2.  Acute-on-chronic hemolytic anemia due to pyruvate kinase deficiency. Hemoglobin was 6.3, and she was feeling weak so she was given 1 unit of packed RBCs which appropriately elevated her hemoglobin and she felt stronger.  3.  Headache: Initially thought to be migraine, but she did have some sinus symptoms which could have actually caused her flulike illness symptoms when she came in, so she was discharged on Levaquin and tramadol p.r.n.   Her course has been otherwise uneventful in the hospital. [facility] was [facility].   DISCHARGE CONDITION: Stable.   DISCHARGE DISPOSITION: Home.   Time spent on discharge: Forty-five minutes.    ____________________________ Enid Baasadhika Revia Nghiem, MD rk:dm D: 07/02/2012 10:29:11 ET T: 07/02/2012 11:07:51 ET JOB#: [id number]  cc: Enid Baasadhika Alvaro Aungst, MD, <Dictator> Enid BaasADHIKA Dwanda Tufano MD ELECTRONICALLY SIGNED 07/09/2012 15:31

## 2014-05-23 NOTE — H&P (Signed)
PATIENT NAME:  Ruth Shelton, ESCH MR#:  [id number] DATE OF BIRTH:  Jun 24, 1980  DATE OF ADMISSION:  06/28/2012  PRIMARY CARE PHYSICIAN: Dr. Darreld Mclean.   REFERRING PHYSICIAN: Dr. Governor Rooks.   CHIEF COMPLAINT: Flu-like symptoms.   HISTORY OF PRESENT ILLNESS: Ms. Cangemi is a 34 year old white female with a past medical history of pyruvate deficiency, hemolytic anemia, chronic elevation of the total bilirubin status post splenectomy, chronically on antibiotics prophylactically who comes to the Emergency Department with complaints of flu-like symptoms for the last 2 to 3 days. The patient also has been experiencing nausea, vomiting and diarrhea. Had multiple episodes of diarrhea today; however, did not have any diarrhea since she came to the Emergency Department. The patient was also noted to have fever of 101 at home. This is also associated with some headache on and off. Denies having any cough. Concerning this, came to the Emergency Department. Workup in the Emergency Department, the patient is found to have fever of 99.6. Has elevated WBC count of 17,000. There was concern about the patient having nuchal rigidity. Concerning this, the patient was given Rocephin and ampicillin treatment doses for possible meningitis; however, the patient refused lumbar puncture.   The patient is also noted to have a hemoglobin of 7.6 which is the patient's baseline. The patient states last transfusion was 1 year back at [facility].   PAST MEDICAL HISTORY:  1. Pyruvate kinase deficiency.  2. Anemia requiring multiple transfusions, baseline hemoglobin around 7.  3. History of iron overload.  4. 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  5. History of CVA.  6. History of seizure disorder in the past.  7. Status post splenectomy, cholecystectomy and C-section.   ALLERGIES: ASPIRIN AND MORPHINE.   HOME MEDICATIONS:  1. Penicillin-V 1 tablet 2 times a day.  2. Folic acid 1 mg daily.  3. Ativan 0.5 mg as needed.   4. Augmentin 1 tablet 2 times a day.   SOCIAL HISTORY: Quit smoking a few months back. Denies drinking alcohol or using illicit drugs. Currently lives with her boyfriend and her children as well as boyfriend's children. Disabled.   FAMILY HISTORY: Brother with testicular cancer and MI.    REVIEW OF SYSTEMS:  CONSTITUTIONAL: Fever, generalized fatigue, generalized body aches.  EYES: No change in vision.  ENT: No sore throat, shortness of breath, cough, earache.  RESPIRATORY: No cough, shortness of breath.  CARDIOVASCULAR: No chest pain, palpitations. No pedal edema.  GASTROINTESTINAL: As mentioned above, nausea, vomiting and diarrhea.  GENITOURINARY: No dysuria or hematuria.  SKIN: No rash or lesions.  HEMATOLOGIC: No easy bruising or bleeding.  MUSCULOSKELETAL: Has generalized body aches.  NEUROLOGIC: No numbness or weakness.  PSYCHIATRIC: No anxiety or depression.   PHYSICAL EXAMINATION:  GENERAL: This is a well-built, well-nourished, age-appropriate female. Does not look toxic. Lying down in the bed not in distress.  VITAL SIGNS: Temperature 99.6, pulse 71, blood pressure 89/54, respiratory rate of 16, oxygen saturation is 96% on room air.  HEENT: Head normocephalic, atraumatic. There is no scleral icterus. Conjunctivae normal. Pupils equal and react to light. Extraocular movements are intact. Mucous membranes: Mild dryness.  NECK: Supple. No lymphadenopathy. No JVD. No carotid bruit.  CHEST: Has no focal tenderness.  LUNGS: Bilaterally clear to auscultation.  HEART: S1 and S2 regular. No murmurs are heard. No pedal edema. Pulses 2+.  ABDOMEN: Bowel sounds present. Soft. Could not appreciate any hepatomegaly.  SKIN: No rash or lesions.  MUSCULOSKELETAL: Good range of motion  in all of the extremities.  NEUROLOGIC: The patient is alert, oriented to place, person and time. Cranial nerves II through XII intact. No motor and sensory deficits.   LABS: UA negative for nitrites and  leukocyte esterase. CMP is completely within normal limits except for total bilirubin of 4.8 which is the patient's baseline CBC: WBC of 14.5, hemoglobin 7.4, platelet count of 833. Pregnancy test is negative.   ASSESSMENT AND PLAN: [name] is a 34 year old female who comes to the Emergency Department with complaints of fever and flu-like symptoms.  1. Viral syndrome causing her to have gastroenteritis. Continue with intravenous fluids, antinausea medication. Considering the patient's immunosuppressive state, will check the Clostridium difficile toxin.  2. Fever: Source still think strongly is from the gastroenteritis; however, considering the patient's immunosuppressive state, will keep the patient on broad-spectrum antibiotics until the cultures come back to be negative.  3. Anemia: The patient is chronically anemic from hemolytic anemia which is stable around 7. Continue the folic acid.   4. Elevated total bilirubin: Most likely from the hemolytic anemia; however, the patient's hemoglobin is stable.  5. Keep the patient on deep vein thrombosis prophylaxis with sequential compression devices.   TIME SPENT: 45 minutes.   ____________________________ Susa GriffinsPadmaja Hulon Ferron, MD pv:gb D: 06/29/2012 01:41:37 ET T: 06/29/2012 02:57:01 ET JOB#: [id number]  cc: Susa GriffinsPadmaja Orton Capell, MD, <Dictator> Leanna SatoLinda M. Miles, MD Susa GriffinsPADMAJA Phillip Maffei MD ELECTRONICALLY SIGNED 06/29/2012 7:35

## 2014-05-24 NOTE — H&P (Signed)
PATIENT NAME:  Ruth Shelton, Ruth Shelton MR#:  [id number] DATE OF BIRTH:  1980-02-06  DATE OF ADMISSION:  01/25/2013  PRIMARY CARE PROVIDER: Delight Stare, MD  ED REFERRING PHYSICIAN: Delman Kitten, MD  CHIEF COMPLAINT: Left upper quadrant abdominal pain, cough, congestion, flulike symptoms.   HISTORY OF PRESENT ILLNESS: The patient is a 34 year old white female with history of pyruvate kinase deficiency, hemolytic anemia, chronic elevation of total bilirubin, status post splenectomy, who is chronically on antibiotics prophylactically, comes to the ED 2 days ago with flulike symptoms and was diagnosed with influenza. She was discharged home on Tamiflu. After discharge she continues to be not feeling well and has been sick with flulike symptoms. She also started having pain in the left upper quadrant, which was severe. She has been also having intermittent fevers. The patient came to the ED, had a CT scan of the abdomen which was negative. We are asked to admit the patient for persistent symptoms in light of her chronic illness.   PAST MEDICAL HISTORY:  1.  Pyruvate kinase deficiency. 2.  Hemolytic anemia requiring transfusion. Baseline hemoglobin around 7.  Was transfused 2 days ago.  3.  Iron overload.  4.  Stillbirth in the past.  5.  Cerebrovascular accident.  6.  Seizure disorder in the past.  7.  Status post splenectomy, cholecystectomy, and C-section.   ALLERGIES: ASPIRIN, MORPHINE.   CURRENT MEDICATIONS: She is on penicillin V 500 mg 1 tab p.o. b.i.d., folic acid 1 mg daily, ibuprofen 800 mg 1 tab p.o. t.i.d., and Tamiflu 75 mg 1 tab p.o. b.i.d.   SOCIAL HISTORY: She used to smoke, quit a few months prior. Denies any alcohol or drug use.   FAMILY HISTORY: Brother with testicular cancer and MI.   REVIEW OF SYSTEMS:  CONSTITUTIONAL: Complains of fever, generalized fatigue, body aches.  EYES: Denies any blurred vision. No cataracts. No glaucoma.  ENT: Complains of sore throat, nasal congestion,  cough. No difficulty with swallowing. Complains of earache.  RESPIRATORY: Complains of productive greenish sputum. Denies any shortness of breath, wheezing. No COPD. No asthma.  CARDIOVASCULAR: Denies any chest pain, palpitations. No syncope. No hypertension.  GASTROINTESTINAL: Has been feeling a little nauseous but no vomiting. Has had diarrhea since drinking the fluid. Complains of severe left upper quadrant pain.  GENITOURINARY: Denies any dysuria, hematuria, frequency, or urgency.  SKIN: Denies any rash. Has chronic jaundice.  HEMATOLOGIC: Denies easy bruisability or bleeding. Has hemolytic anemia.  MUSCULOSKELETAL: Has generalized body aches.  NEUROLOGIC: No numbness. Has a history of CVA in the past. Has also history of seizures, not on any medication.  PSYCHIATRIC: Denies any anxiety or depression.   PHYSICAL EXAMINATION: VITAL SIGNS: Temperature 98.6, pulse 82, respirations 18, blood pressure 107/61, and O2 100% on room air.  GENERAL: The patient is a chronically ill-appearing female, appears uncomfortable due to pain.  HEENT: Head atraumatic, normocephalic. Pupils equally round and reactive to light and accommodation. There is no conjunctival pallor. She has scleral icterus. Nasal exam shows bogginess. Oropharynx is clear without any exudates.  NECK: Supple without any JVD.  CARDIOVASCULAR: Regular rate and rhythm. No murmurs, rubs, clicks, or gallops. PMI is not displaced.  LUNGS: Clear to auscultation bilaterally without any rales, rhonchi, or wheezing. ABDOMEN: She has some left upper quadrant tenderness. No guarding. No rebound. Positive bowel sounds x4.  EXTREMITIES: No clubbing, cyanosis, or edema.  SKIN: No rash.  LYMPHATICS: No lymph nodes palpable.  VASCULAR: Good DP and PT pulses.  PSYCHIATRIC: Not  anxious or depressed.  NEUROLOGIC: Awake, alert, and oriented x3. No focal deficits.   LABORATORY AND DIAGNOSTICS: Glucose 100, BUN 8, creatinine 0.37, sodium 134, potassium  4.5, chloride 104, CO2 25, calcium 8.6. Lipase 111. Beta-hCG less than 1. LFTs: Total protein 6.9, albumin 3.8, bili total 4.7, alk phos 79, AST 115, and ALT 108. TUDs are negative. WBC 9.4, hemoglobin 7.2, and platelet count 724. Urinalysis: Nitrites negative, leukocytes negative. Pregnancy is negative. Influenza A was positive.   CT of the abdomen shows small mesenteric lymph nodes, right lower quadrant, suggesting a mesenteric adenitis. Mild hydronephrosis and hydroureter on the right.  No stone identified. Right adnexal cyst.    ASSESSMENT AND PLAN: The patient is a 34 year old white female with history of pyruvate kinase deficiency and hemolytic anemia seen in the ED 2 days ago with flulike illness, now presents with worsening symptoms, coughing, congestion, and complains of severe left upper quadrant pain. CT of the abdomen is negative.  1.  Influenza. Will continue Tamiflu. The patient with her chronic illnesses and hemolytic anemia and pyruvate kinase deficiency, we will treat her aggressively with IV fluids.  2.  Left upper quadrant pain. Will check a lipase.  LFTs have been chronically elevated. CT scan is unrevealing. I will also check a chest x-ray in light of her cough to make sure she does not have a pneumonia.  3.  History of hemolytic anemia status post recent transfusion. Follow CBC.  4.  Incidental finding of right hydronephrosis, likely chronic in nature and needs outpatient urology followup.  5.  History of cerebrovascular accident. 6.  Miscellaneous. We will do sequential compression devices for deep vein thrombosis prophylaxis.  TIME SPENT ON DISCHARGE: 45 minutes. ____________________________ Lafonda Mosses Posey Pronto, MD shp:sb D: 01/25/2013 16:36:35 ET T: 01/25/2013 17:16:35 ET JOB#: [id number]  cc: Izella Ybanez H. Posey Pronto, MD, <Dictator> Alric Seton MD ELECTRONICALLY SIGNED 02/03/2013 8:32

## 2014-05-24 NOTE — Discharge Summary (Signed)
PATIENT NAME:  Ruth BrookeSOUTHARD, Ruth Shelton MR#:  [id number] DATE OF BIRTH:  May 29, 1980  DATE OF ADMISSION:  01/26/2013  DATE OF DISCHARGE:  01/28/2013  PRIMARY CARE PHYSICIAN:  Dr. Darreld McleanLinda Miles  FINAL DIAGNOSES: 1.  Clostridium difficile colitis with mesenteric adenitis with abdominal pain.  2.  Influenza.  3.  Hemolytic anemia history, requiring 1 unit of blood during the hospitalization. 4.  Pyruvate kinase deficiency.   MEDICATIONS ON DISCHARGE: Include folic acid 1 mg daily in the morning, penicillin-V 250 mg twice a day, ibuprofen 800 mg 3 times a day, metronidazole 500 mg every 8 hours for 10 days, Tamiflu 75 mg twice a day, complete your course through December 30, oxycodone 5 mg every 4 hours as needed for pain, lactobacillus 1 capsule twice a day for 14 days.  INSTRUCTIONS: Follow up in 1 to 2 weeks with Dr. Darreld McleanLinda Miles. Follow up with your hematologist as scheduled.   HISTORY: The patient was admitted 01/26/2013, discharged 01/28/2013. Came in with left upper quadrant abdominal pain, cough, congestion, flu-like symptoms. The patient was recently diagnosed with influenza. Tamiflu was continued. The patient was having abdominal pain. CT scan unremarkable. The patient was admitted to the hospital. Laboratory and radiological data during the hospital course included a beta-hCG of less than 1, lipase 111, glucose 100, BUN 8, creatinine 0.37, sodium 134, potassium 4.5, chloride 104, CO2 of 25, calcium 8.6. Liver function tests: Total bilirubin 4.7, alkaline phosphatase 79, ALT 108, AST 115. White blood cell count 9.4, H and H 7.2 and 21.0, platelet count of 724, MCV 108. Urine toxicology negative. Pregnancy test negative. Urinalysis: Trace leukocyte esterase. CT scan of the abdomen and pelvis with contrast:  Small mesenteric lymph nodes right lower quadrant suggestive of mesenteric adenitis. Mild hydronephrosis, nonobstructing stone, right adnexal cyst. Hemoglobin on December 27 dipped down to 6.2. Later in  the day on December 22, stool for C. diff was positive. Hemoglobin upon discharge 8.0. Stool culture: No pathogenic E. coli, no Campylobacter.  HOSPITAL COURSE PER PROBLEM LIST:  1. For the patient's abdominal pain, CT scan showed mesenteric adenitis. The patient then developed some diarrhea after the CAT scan. Stool for C. diff was sent off, and that was positive. The patient was started on Flagyl. The patient still did have some pain upon discharge, helped out with oxycodone. A treatment course of Flagyl was given. I believe this is secondary to patient recently having a UTI and on urinary infection medications.   2. Influenza. The patient was diagnosed prior to admission, was started on Tamiflu and continued the in the hospital, and will finish the course on December 30.   3. Hemolytic anemia, requiring 1 unit of blood. The patient did respond well to the unit of blood. Follow up with hematologist as outpatient. Looking back, patient does have some chronically elevated total bilirubin and liver function tests.  4.  Pyruvate kinase deficiency. Follow up as outpatient with hematologist.    Time spent on discharge:  35 minutes.     ____________________________ Herschell Dimesichard Shelton. Renae GlossWieting, MD rjw:mr D: 01/28/2013 15:45:00 ET T: 01/28/2013 19:17:58 ET JOB#: [id number]  cc: Leanna SatoLinda M. Miles, MD Herschell Dimesichard Shelton. Renae GlossWieting, MD, <Dictator>   Salley ScarletICHARD Shelton Caroleena Paolini MD ELECTRONICALLY SIGNED 01/31/2013 16:30

## 2014-05-25 NOTE — H&P (Signed)
PATIENT NAME:  Ruth Shelton, [name] MR#:  [id number] DATE OF BIRTH:  09-Oct-1980  DATE OF ADMISSION:  02/15/2011  PRIMARY CARE PHYSICIAN: [facility] ER REFERRING PHYSICIAN: Dr. Lorenso CourierPowers.   CHIEF COMPLAINT: Fevers, chills, nausea, vomiting, headache, sinus pain.   HISTORY OF PRESENT ILLNESS: The patient is a 34 year old white female with history of pyruvate kinase deficiency, anemia, history of iron overload requiring multiple transfusions, at least 40 throughout her life. Also, has a history of splenectomy and is on chronic penicillin therapy. She reports that she started feeling bad about a week ago, started having sinus pain, nasal congestion. Subsequently, developed headaches with radiation of the pain in back of her eyes. These were frontal sharp headaches. Then since last night, she started having nausea, vomiting, and diarrhea, and started having fevers. She checked her temperature yesterday. It was 101. Again, had a temperature of 101, came to the ED. She currently complains of also sore throat. She had an LP done in the ED which is currently pending. Otherwise, she also complains of chest pain with coughing. Complains of abdominal cramps with nausea, vomiting, and diarrhea. She denies any recent travel. No sick contacts. She denies photophobia. Light does bother her headache. She, otherwise, denies any significant stiff neck. She has not had any urinary symptoms including burning, hesitancy. She reports that chronically she is a little jaundiced as a result of her hemolysis which is unchanged.   PAST MEDICAL HISTORY:  1. Pyruvate kinase deficiency.  2. Anemia requiring transfusion. According to the patient, her baseline hemoglobin is around seven. Last transfusion was March of last year.  3. History of iron overload.  4. Status post two C-sections.  5. History of stillborn birth.  6. Status post cholecystectomy.  7. Status post splenectomy.   ALLERGIES: Allergy to aspirin as well as morphine.    CURRENT MEDICATIONS:  1. Penicillin VK 250 mg b.i.d.  2. Folic acid 1 mg daily.   SOCIAL HISTORY: Smokes about 1/2 pack per day. No alcohol, no drug use. Currently on disability.   FAMILY HISTORY: States that brother with testicular cancer. She is not sure if any other family member has pyruvate kinase deficiency.   REVIEW OF SYSTEMS: CONSTITUTIONAL: Complains of fever, fatigue, weakness, abdominal cramping. Complains of headache. No weight loss. No weight gain. EYES: No blurred or double vision. No pain. No redness. No inflammation. No glaucoma. No cataracts. ENT: No tinnitus. No ear pain. No hearing loss. No allergies, seasonal or year-round. No epistaxis. Does complain of nasal congestion. Complains of bilateral sinus pain. Denies any difficulty with swallowing. RESPIRATORY: Complains of dry cough. No wheezing. No hemoptysis. No dyspnea. No asthma. No chronic obstructive pulmonary disease. No tuberculosis. No pneumonia. CARDIOVASCULAR: Complains of chest pain with coughing. No orthopnea. No edema. No arrhythmia. No syncope. GASTROINTESTINAL: Nausea, vomiting, diarrhea as above, abdominal cramping with emesis. No hematemesis. No melena. No ulcers. No gastroesophageal reflux disease. No irritable bowel syndrome. No jaundice. No hemorrhoids. GENITOURINARY: Denies any dysuria, hematuria, renal calculus, frequency, or incontinence. GYN: Denies any breast mass, tenderness or discharge. ENDOCRINE: Denies any polydipsia, nocturia, or thyroid problems. No increase in sweating, heat or cold intolerance or thirst. HEME/LYMPH: Has chronic anemia. Denies easy bruising or bleeding. No swollen glands. SKIN: No acne. No rash. No changes in mole, hair or skin. MUSCULOSKELETAL: Complains of some pain in the back which is chronic. No swelling. No gout. No redness. NEUROLOGICAL: No numbness. No weakness. She reports history of cerebrovascular accident and seizure with cerebrovascular accident  leading to some memory loss.  PSYCHIATRIC: Denies any anxiety, insomnia, or ADD, OCD. No bipolar depression.   PHYSICAL EXAMINATION:  VITAL SIGNS: Temperature 99.7, pulse 113, respirations 18, initial blood pressure 99/56.   GENERAL: The patient appears stated age, appears ill, in no acute distress.   HEENT: Head atraumatic, normocephalic. Pupils are equally round, reactive to light and accommodation. There is scleral icterus. There is conjunctival pallor. Extraocular movements intact. Nasal exam shows the posterior nasal area to be boggy. There is no drainage. Oropharynx: There is no exudate noted in her posterior pharynx. Dentition appears stable. Ear exam shows no erythema or swelling externally.   NECK: There is no thyromegaly. No carotid bruits.   CARDIOVASCULAR: Regular rate and rhythm. No murmurs, rubs, clicks, or gallops. PMI is not displaced.   LUNGS: Clear to auscultation bilaterally without any rales, rhonchi, or wheezing.   ABDOMEN: Soft, nontender, nondistended. Positive bowel sounds x4. There is no hepatosplenomegaly.   EXTREMITIES: No clubbing, cyanosis, or edema.   NEUROLOGICAL: Currently awake, alert, oriented x3. No focal deficits. Cranial nerves II through XII grossly intact. The patient has no neck stiffness. Babinski's downgoing.   SKIN: There is no rash.   LYMPHATICS: No lymph nodes palpable.   MUSCULOSKELETAL: There is no erythema or swelling.   VASCULAR: Good DP, PT pulses.   PSYCHIATRIC: Not anxious or depressed.   LABORATORY, DIAGNOSTIC, AND RADIOLOGICAL DATA: In the ED, her BMP, glucose 96, BUN 11, creatinine 0.47, sodium 139, potassium 3.7, chloride 109, CO2 23. Calcium 7.9, LDH 184. LFTs: total protein 6.1, albumin 3.6, bilirubin total 6.4, alkaline phosphatase 69, AST 55, ALT 74, WBC 21.8, hemoglobin 7.5, platelet count 852. Her urinalysis showed 1+ blood, nitrates negative, leukocytes negative. Pregnancy test was negative. Chest x-ray is negative.   ASSESSMENT AND PLAN: The patient  is a 34 year old white female with history of pyruvate kinase deficiency with history of recurrent anemia needing transfusions in the past who has not felt well for the past week, has multiple complaints, comes in with fever, leukocytosis.  1. Fever of unclear source. At this time her chest x-ray is negative. Urinalysis negative. Has sinus symptoms, headache, status post LP. The results are currently pending. Due to her history of pyruvate kinase deficiency, splenectomy, at this time I will go ahead and keep her on broad-spectrum antibiotics for now. Follow her blood cultures. Follow her temperature curve. If all these are negative, then likely can discontinue. This all could be just a viral syndrome. Could also have sinusitis causing some of these issues. At this time, I will continue vancomycin and Zosyn until we have further results.  2. Nausea, vomiting, diarrhea likely viral gastroenteritis. We will check stool studies for Clostridium difficile, ova and parasites and WBC. If does not improve, we will consider gastroenterology evaluation.  3. Pyruvate kinase deficiency with anemia. The patient states baseline of 7. At this time her hemoglobin may be much lower. I will hydrate the patient and recheck hemoglobin later today. If below 7, I will go ahead and transfuse her with 2 units of packed RBCs.  4. History of seizure, cerebrovascular accident, likely related to #3. Monitor for any symptoms of disease.  5. Miscellaneous. We will place her on gastrointestinal prophylaxis with Protonix in light of her significant anemia. Will place her on lower extremity compression stockings.   TIME SPENT: 40 minutes spent.   ____________________________ Lacie [facility]. Allena Katz, MD shp:ap D: 02/15/2011 15:53:08 ET T: 02/15/2011 16:51:51 ET JOB#: [id number]  cc: Aristide Waggle H.  Allena Katz, MD, <Dictator> [facility] Molinda Bailiff MD ELECTRONICALLY SIGNED 03/05/2011 8:14

## 2014-05-25 NOTE — Discharge Summary (Signed)
PATIENT NAME:  Ruth Shelton, Ruth Shelton MR#:  [id number] DATE OF BIRTH:  January 05, 1981  DATE OF ADMISSION:  02/15/2011 DATE OF DISCHARGE:  02/17/2011  DISCHARGE DIAGNOSES:  1. Fever and headache and diarrhea secondary to viral syndrome, resolved.  2. Hypokalemia secondary to diarrhea.  3. Anemia because of pyruvate kinase deficiency status post transfusion, stable.   HOME MEDICATIONS:  1. Folic acid 1 mg p.o. daily in the morning. 2.  3. Potassium 250 mg 1 tablet p.o. b.i.d.   ADDITIONAL MEDICATION: Percocet 5/325 mg q.6 hours p.r.n. for headache.   FOLLOW UP: This [facility] at 1:10 p.m.   CONSULTATIONS: None.   HOSPITAL COURSE: 34 year old female with history of pyruvate kinase deficiency gets transfusions and also history of iron overload, came in because of:  1. Fever, headache and nausea along with chills and sinus pain. Patient had a fever of 101 in the ER. Patient because of the headache and nausea she had a CAT scan which did not show any acute changes. After that LP was done by ER physician. Patient was admitted to hospitalist service for fever of unclear significance. WBC 21.8 on admission. Rest of the labs showed within normal limits. Urinalysis showed leukocyte esterase negative and she was showing no pneumonia on chest x-ray. Patient's <flu > was also negative. She was admitted to hospitalist service. She received lumbar puncture by the ER physician and started on fluids, vancomycin and Zosyn and also IV fluids were restarted. She had diarrhea during hospital stay. Stool for Clostridium difficile were sent which were negative after that she received Imodium.  She felt better the next day and wanted to eat diet so we started her on diet and decreased IV fluids. Patient complained of severe back pain after the lumbar puncture because of multiple attemptsand patient did not have any neurological deficits or swelling of the lumbar puncture site  she  requested some pain pills to help with back pain. By next day she felt much better in respect to back pain and she was ready to go home.   2. History of pyruvate kinase deficiency. Patient's baseline hemoglobin is around 7. Hemoglobin actually showed 7.5 here and she was symptomatic with tiredness and fatigue so she received blood transfusion this hospitalization.  3. History of cerebrovascular accident and seizure during childbirth and did not have any seizures after childbirth and she was on phenobarbital briefly. She was neurologically intact with no symptoms.    LABORATORY, DIAGNOSTIC AND RADIOLOGICAL DATA: Electrolytes on admission: Sodium 140, potassium 3.1, chloride 109, bicarbonate 24, BUN 6, creatinine 0.56. WBC on 01/6 10.8, hemoglobin 9, hematocrit 27.3, platelets 533. Potassium 2.1 on 01/16 but patient did have diarrhea on 01/16. Later on diarrhea improved and she started to take p.o.   Stools were negative for Clostridium difficile and also CSF cultures were negative. Chest x-rays done showing pneumonia. Her blood cultures have been negative so we thought it is viral syndrome and patient discharged home in stable condition. She was advised to follow with her primary doctor.   Patient also had negative flu test.   ____________________________ Katha HammingSnehalatha Kare Dado, MD sk:cms D: 02/21/2011 22:52:57 ET T: 02/23/2011 09:27:50 ET JOB#: [id number]  cc: Katha HammingSnehalatha Braylinn Gulden, MD, <Dictator> Katha HammingSNEHALATHA Tanishka Drolet MD ELECTRONICALLY SIGNED 03/15/2011 14:48

## 2015-01-22 IMAGING — CR DG CHEST 2V
1 series · 2 of 2 positions shown · non-contrast
Comparison: 11/22/2012

CLINICAL DATA: Cough, fever

EXAM:
CHEST  2 VIEW

[Series 1: pa · 0.17mm/px · 2 of 2 slices shown]
[im 1/2]
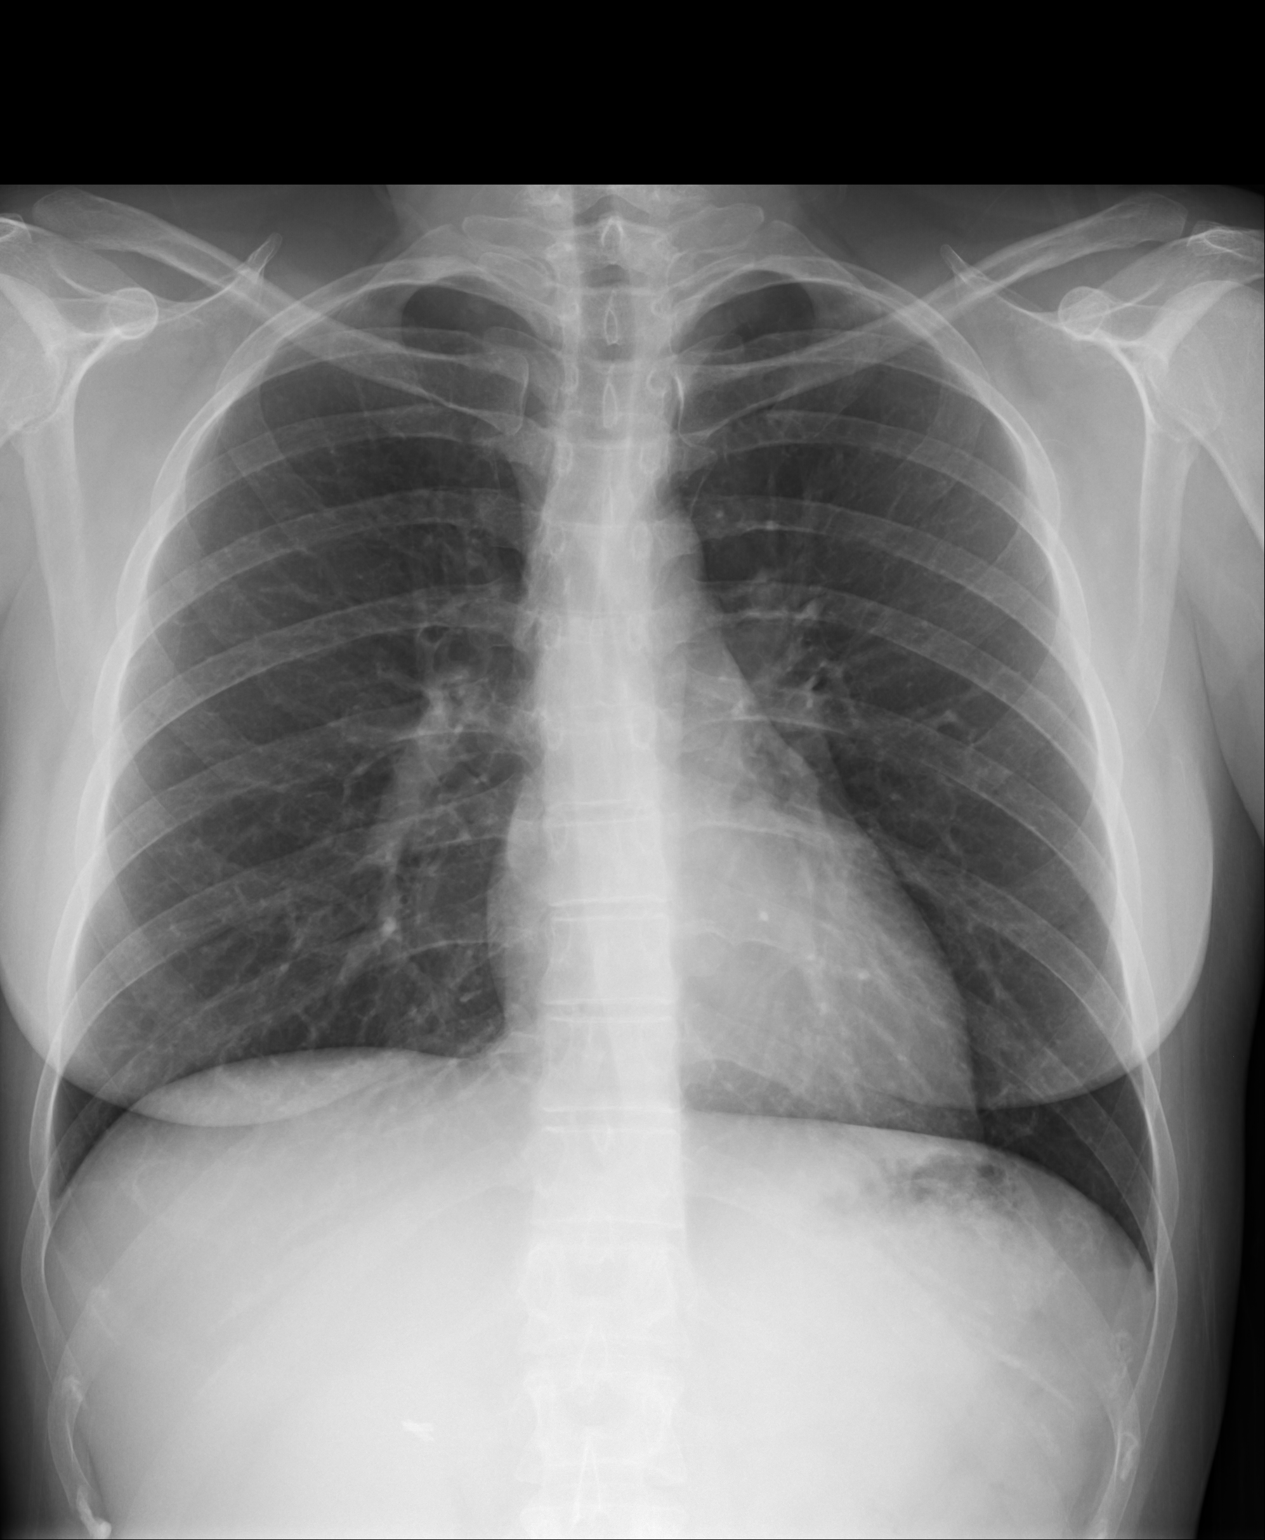
[im 2/2]
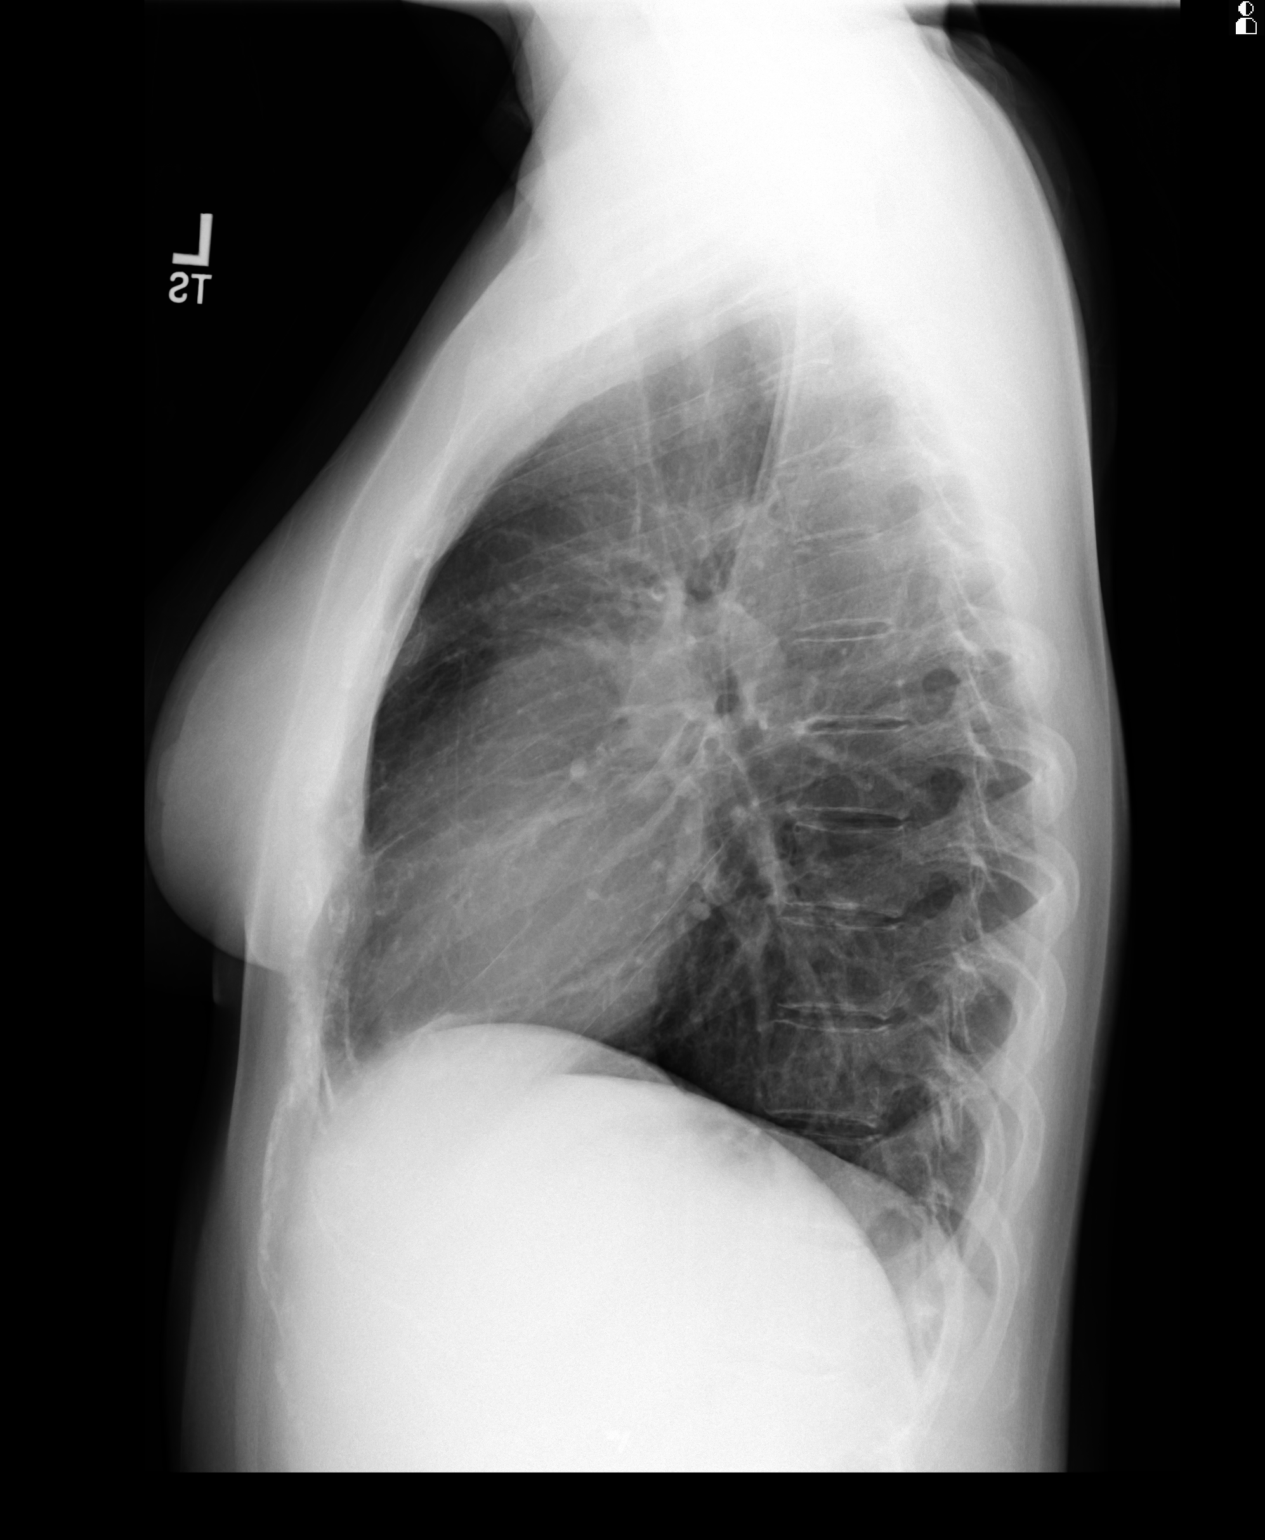

[2 of 2 positions shown; findings below may reference images not displayed]

FINDINGS: The heart size and mediastinal contours are within normal limits.
Both lungs are clear. The visualized skeletal structures are
unremarkable. Prior cholecystectomy noted.
IMPRESSION: No active cardiopulmonary disease.

## 2015-01-24 IMAGING — CT CT ABD-PELV W/ CM
2 of 4 series · 16 of 46 positions shown, 18 images · IV contrast (isovue)
Comparison: CT abdomen pelvis 11/13/2009.

CLINICAL DATA: Left upper quadrant pain.

EXAM:
CT ABDOMEN AND PELVIS WITH CONTRAST
TECHNIQUE: Multidetector CT imaging of the abdomen and pelvis was performed
using the standard protocol following bolus administration of
intravenous contrast.
CONTRAST:  125 cc Isovue 370.

[Series 2: routine abd pel with · axial · 0.74mm/px · z∈[+266,+722]mm · 13 of 99 slices shown, 15 images]
[im 4/99  soft-tissue]
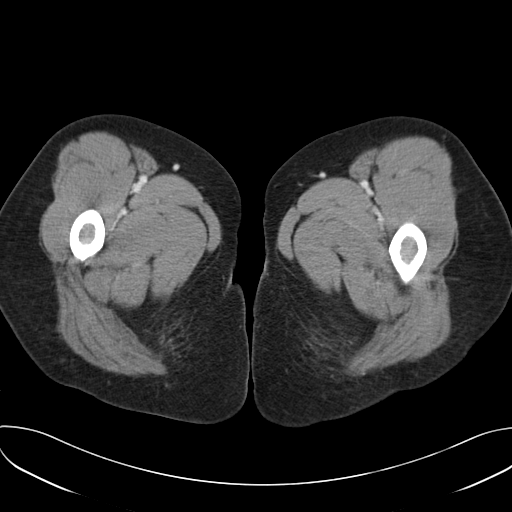
[im 4/99  bone]
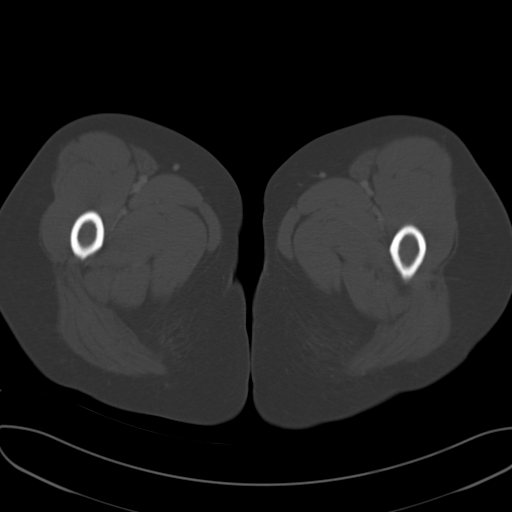
[im 12/99  soft-tissue]
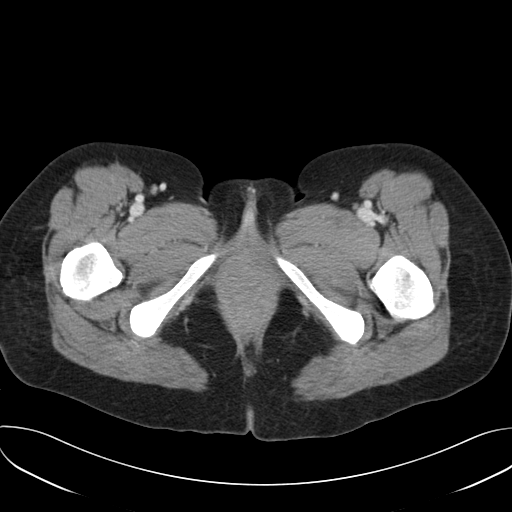
[im 20/99  soft-tissue]
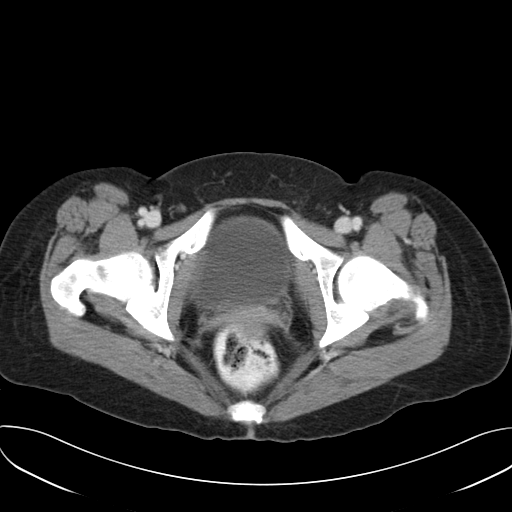
[im 28/99  soft-tissue]
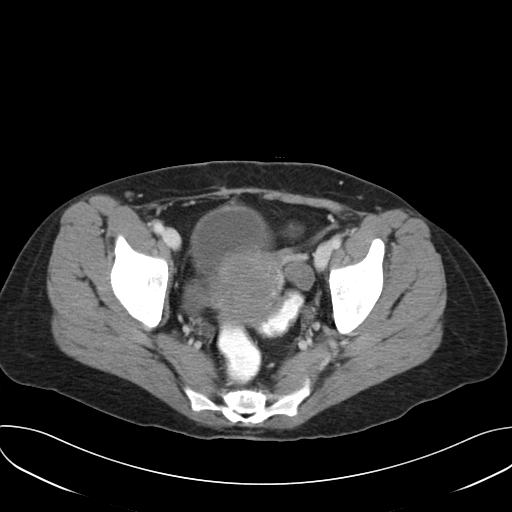
[im 36/99  soft-tissue]
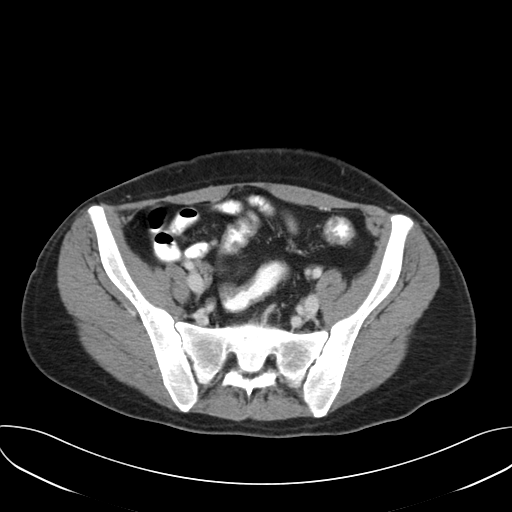
[im 44/99  soft-tissue]
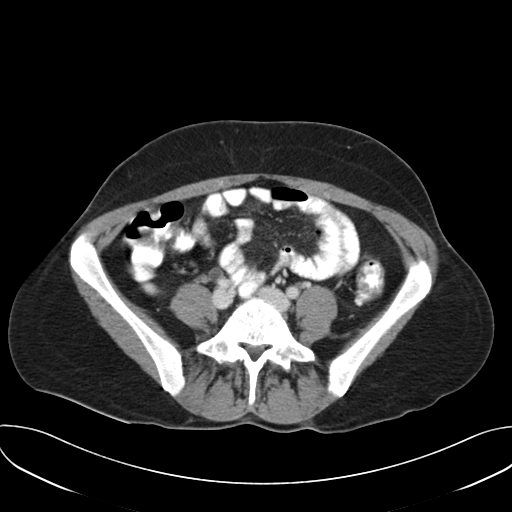
[im 51/99  soft-tissue]
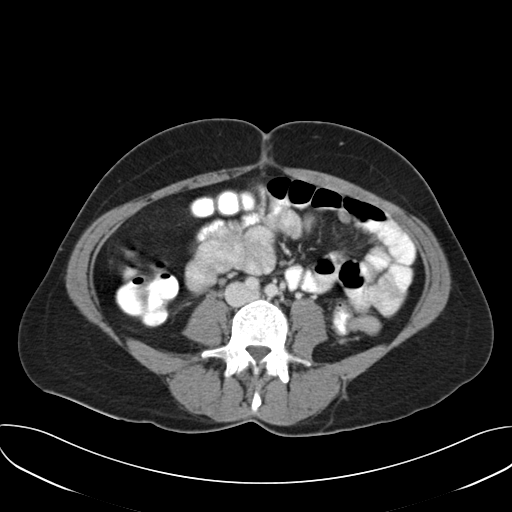
[im 55/99  soft-tissue]
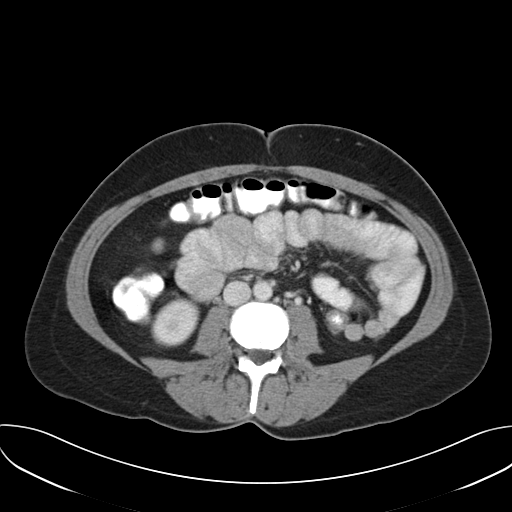
[im 63/99  soft-tissue]
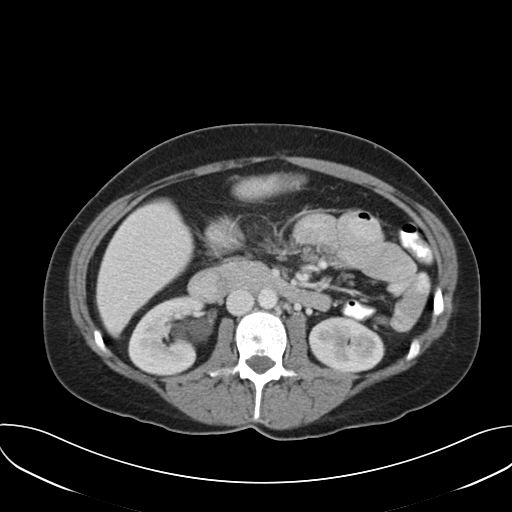
[im 63/99  bone]
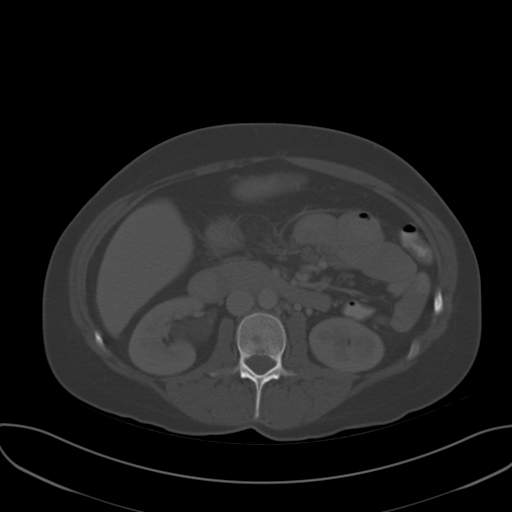
[im 71/99  soft-tissue]
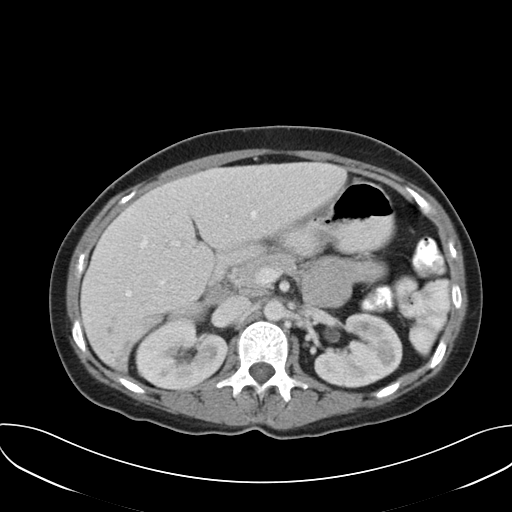
[im 79/99  soft-tissue]
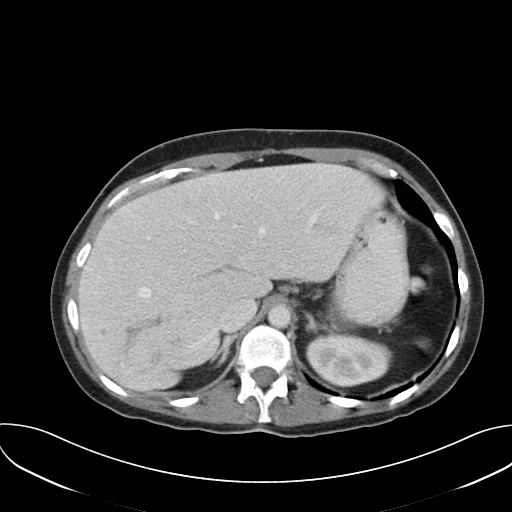
[im 87/99  soft-tissue]
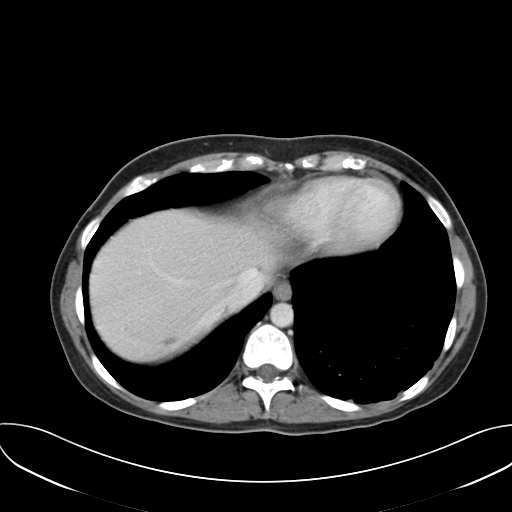
[im 95/99  soft-tissue]
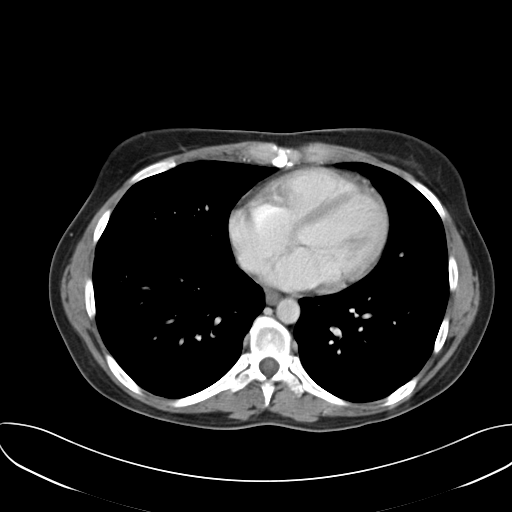

[Series 5: cor routine abd pel with · coronal · 0.73mm/px · 3 of 127 slices shown]
[im 43/127  soft-tissue]
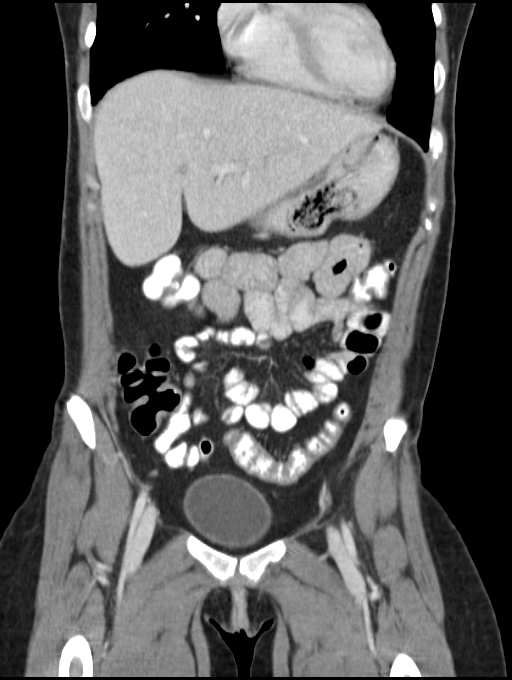
[im 57/127  soft-tissue]
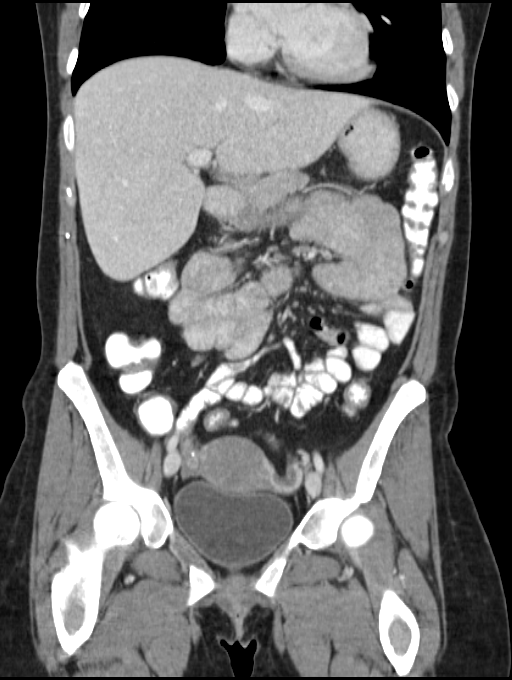
[im 71/127  soft-tissue]
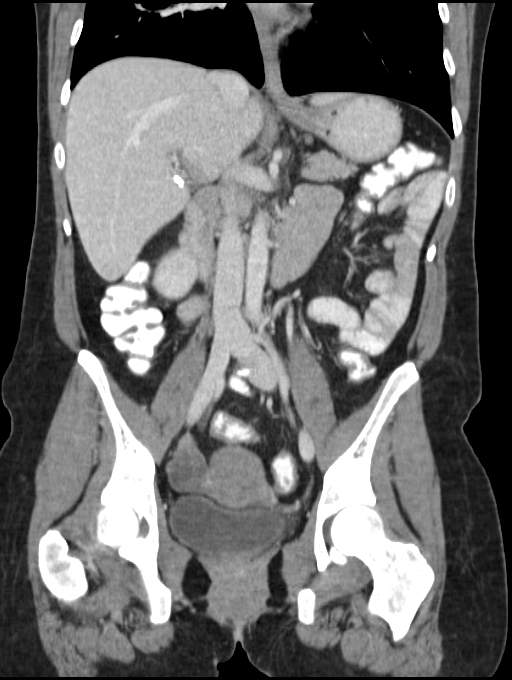

[16 of 46 positions shown; findings below may reference images not displayed]

FINDINGS: No focal hepatic abnormality identified. No intra biliary ductal an
extra biliary ductal prominence noted most likely from prior
cholecystectomy. This is stable from 6344. Splenectomy. Pancreas is
normal.

Adrenals are normal. No focal renal abnormality. Stable cysts both
kidneys. Mild right hydronephrosis and hydroureter present. Bladder
is nondistended. No free pelvic fluid. There is a 2.8 x 1.9 cm right
adnexal cyst.

No significant adenopathy. Aorta normal caliber. Hepatic veins,
portal vein, splenic vein, and visceral vessels are patent.

Small mesenteric lymph nodes are noted in the right lower quadrant.
Mesenteric adenitis could present in this fashion. Appendix not
visualized. Terminal ileum appears normal. No inflammatory change in
the right or left lower quadrant otherwise noted. There is no
evidence of bowel obstruction. Esophagogastric region is normal. No
free air is noted.

Heart size normal. Minimal atelectasis versus infiltrate left lung
base. Tiny umbilical hernia with herniation of fat only. No acute
bony abnormality.
IMPRESSION: 1. Small mesenteric lymph nodes right lower quadrant suggesting
mesenteric adenitis. Appendix not visualized.

2. Mild hydronephrosis and hydroureter, no obstructing ureteral
stone identified. The bladder is nondistended.

3. Right adnexal cyst, most likely simple ovarian cyst. Ultrasound
can be obtained for further evaluation.

## 2019-07-02 DEATH — deceased
